# Patient Record
Sex: Female | Born: 1980 | Race: Black or African American | Hispanic: No | Marital: Married | Smoking: Never smoker
Health system: Southern US, Community
[De-identification: ages and names within clinical notes are randomized; demographics above are authoritative.]

## PROBLEM LIST (undated history)

## (undated) DIAGNOSIS — D62 Acute posthemorrhagic anemia: Secondary | ICD-10-CM

## (undated) DIAGNOSIS — Z8619 Personal history of other infectious and parasitic diseases: Secondary | ICD-10-CM

## (undated) DIAGNOSIS — D509 Iron deficiency anemia, unspecified: Secondary | ICD-10-CM

## (undated) DIAGNOSIS — O99019 Anemia complicating pregnancy, unspecified trimester: Secondary | ICD-10-CM

## (undated) HISTORY — PX: DILATION AND CURETTAGE OF UTERUS: SHX78

## (undated) HISTORY — PX: OTHER SURGICAL HISTORY: SHX169

---

## 1999-05-15 ENCOUNTER — Emergency Department (HOSPITAL_COMMUNITY): Admission: EM | Admit: 1999-05-15 | Discharge: 1999-05-15 | Payer: Self-pay | Admitting: Emergency Medicine

## 1999-09-13 ENCOUNTER — Inpatient Hospital Stay (HOSPITAL_COMMUNITY): Admission: AD | Admit: 1999-09-13 | Discharge: 1999-09-13 | Payer: Self-pay | Admitting: *Deleted

## 1999-09-20 ENCOUNTER — Inpatient Hospital Stay (HOSPITAL_COMMUNITY): Admission: AD | Admit: 1999-09-20 | Discharge: 1999-09-20 | Payer: Self-pay | Admitting: *Deleted

## 1999-11-02 ENCOUNTER — Ambulatory Visit (HOSPITAL_COMMUNITY): Admission: RE | Admit: 1999-11-02 | Discharge: 1999-11-02 | Payer: Self-pay | Admitting: *Deleted

## 1999-12-13 ENCOUNTER — Ambulatory Visit (HOSPITAL_COMMUNITY): Admission: RE | Admit: 1999-12-13 | Discharge: 1999-12-13 | Payer: Self-pay | Admitting: *Deleted

## 2000-04-12 ENCOUNTER — Encounter (HOSPITAL_COMMUNITY): Admission: RE | Admit: 2000-04-12 | Discharge: 2000-04-20 | Payer: Self-pay | Admitting: Obstetrics

## 2000-04-19 ENCOUNTER — Inpatient Hospital Stay (HOSPITAL_COMMUNITY): Admission: RE | Admit: 2000-04-19 | Discharge: 2000-04-21 | Payer: Self-pay | Admitting: Obstetrics

## 2000-07-17 ENCOUNTER — Emergency Department (HOSPITAL_COMMUNITY): Admission: EM | Admit: 2000-07-17 | Discharge: 2000-07-18 | Payer: Self-pay | Admitting: *Deleted

## 2000-07-18 ENCOUNTER — Encounter: Payer: Self-pay | Admitting: Emergency Medicine

## 2000-07-31 ENCOUNTER — Emergency Department (HOSPITAL_COMMUNITY): Admission: EM | Admit: 2000-07-31 | Discharge: 2000-07-31 | Payer: Self-pay | Admitting: Emergency Medicine

## 2000-08-02 ENCOUNTER — Encounter: Payer: Self-pay | Admitting: Emergency Medicine

## 2000-08-02 ENCOUNTER — Emergency Department (HOSPITAL_COMMUNITY): Admission: EM | Admit: 2000-08-02 | Discharge: 2000-08-02 | Payer: Self-pay | Admitting: Emergency Medicine

## 2000-08-04 ENCOUNTER — Emergency Department (HOSPITAL_COMMUNITY): Admission: EM | Admit: 2000-08-04 | Discharge: 2000-08-04 | Payer: Self-pay | Admitting: Emergency Medicine

## 2000-08-07 ENCOUNTER — Emergency Department (HOSPITAL_COMMUNITY): Admission: EM | Admit: 2000-08-07 | Discharge: 2000-08-07 | Payer: Self-pay | Admitting: Emergency Medicine

## 2000-08-19 ENCOUNTER — Emergency Department (HOSPITAL_COMMUNITY): Admission: EM | Admit: 2000-08-19 | Discharge: 2000-08-19 | Payer: Self-pay | Admitting: Emergency Medicine

## 2000-08-22 ENCOUNTER — Emergency Department (HOSPITAL_COMMUNITY): Admission: EM | Admit: 2000-08-22 | Discharge: 2000-08-22 | Payer: Self-pay | Admitting: Emergency Medicine

## 2000-08-22 ENCOUNTER — Encounter: Payer: Self-pay | Admitting: Emergency Medicine

## 2000-08-26 ENCOUNTER — Encounter: Payer: Self-pay | Admitting: Emergency Medicine

## 2000-08-26 ENCOUNTER — Emergency Department (HOSPITAL_COMMUNITY): Admission: EM | Admit: 2000-08-26 | Discharge: 2000-08-26 | Payer: Self-pay | Admitting: Emergency Medicine

## 2000-09-03 ENCOUNTER — Encounter: Admission: RE | Admit: 2000-09-03 | Discharge: 2000-09-03 | Payer: Self-pay | Admitting: Internal Medicine

## 2000-09-06 ENCOUNTER — Encounter: Payer: Self-pay | Admitting: Emergency Medicine

## 2000-09-06 ENCOUNTER — Emergency Department (HOSPITAL_COMMUNITY): Admission: EM | Admit: 2000-09-06 | Discharge: 2000-09-06 | Payer: Self-pay | Admitting: Emergency Medicine

## 2000-09-08 ENCOUNTER — Emergency Department (HOSPITAL_COMMUNITY): Admission: EM | Admit: 2000-09-08 | Discharge: 2000-09-08 | Payer: Self-pay | Admitting: Emergency Medicine

## 2000-09-12 ENCOUNTER — Encounter: Admission: RE | Admit: 2000-09-12 | Discharge: 2000-09-12 | Payer: Self-pay | Admitting: Internal Medicine

## 2000-09-19 ENCOUNTER — Emergency Department (HOSPITAL_COMMUNITY): Admission: EM | Admit: 2000-09-19 | Discharge: 2000-09-19 | Payer: Self-pay | Admitting: Emergency Medicine

## 2000-09-19 ENCOUNTER — Encounter: Payer: Self-pay | Admitting: Emergency Medicine

## 2000-09-20 ENCOUNTER — Encounter: Admission: RE | Admit: 2000-09-20 | Discharge: 2000-09-20 | Payer: Self-pay | Admitting: Internal Medicine

## 2000-09-27 ENCOUNTER — Encounter: Admission: RE | Admit: 2000-09-27 | Discharge: 2000-09-27 | Payer: Self-pay | Admitting: Internal Medicine

## 2001-07-23 ENCOUNTER — Emergency Department (HOSPITAL_COMMUNITY): Admission: EM | Admit: 2001-07-23 | Discharge: 2001-07-23 | Payer: Self-pay | Admitting: Emergency Medicine

## 2003-01-05 ENCOUNTER — Emergency Department (HOSPITAL_COMMUNITY): Admission: EM | Admit: 2003-01-05 | Discharge: 2003-01-06 | Payer: Self-pay | Admitting: Emergency Medicine

## 2003-07-08 ENCOUNTER — Other Ambulatory Visit: Admission: RE | Admit: 2003-07-08 | Discharge: 2003-07-08 | Payer: Self-pay | Admitting: Gynecology

## 2003-10-21 ENCOUNTER — Other Ambulatory Visit: Admission: RE | Admit: 2003-10-21 | Discharge: 2003-10-21 | Payer: Self-pay | Admitting: Gynecology

## 2005-12-04 ENCOUNTER — Other Ambulatory Visit: Admission: RE | Admit: 2005-12-04 | Discharge: 2005-12-04 | Payer: Self-pay | Admitting: Gynecology

## 2010-02-09 ENCOUNTER — Other Ambulatory Visit
Admission: RE | Admit: 2010-02-09 | Discharge: 2010-02-09 | Payer: Self-pay | Source: Home / Self Care | Admitting: Gynecology

## 2010-02-09 ENCOUNTER — Ambulatory Visit: Payer: Self-pay | Admitting: Women's Health

## 2010-03-06 NOTE — L&D Delivery Note (Signed)
Delivery Note At 6:39 AM a viable and healthy female was delivered via Vaginal, Spontaneous Delivery (Presentation: Right Occiput Anterior).  APGAR: 9, 9; weight 7 lb 13.8 oz (3565 g).   Placenta status: Intact, Spontaneous.  Cord: 3 vessels with the following complications: None.  Cord pH: none  Anesthesia: Epidural  Episiotomy: None Lacerations: None Suture Repair: none Est. Blood Loss (mL): 250  Mom to postpartum.  Baby to nursery-stable.  Jessica Gallegos A 12/03/2010, 7:04 AM

## 2010-04-15 ENCOUNTER — Ambulatory Visit: Payer: Self-pay | Admitting: Women's Health

## 2010-04-15 DIAGNOSIS — N912 Amenorrhea, unspecified: Secondary | ICD-10-CM

## 2010-05-03 ENCOUNTER — Emergency Department (HOSPITAL_COMMUNITY)
Admission: EM | Admit: 2010-05-03 | Discharge: 2010-05-03 | Disposition: A | Discharge status: Home / Self Care | Payer: Self-pay | Attending: Emergency Medicine | Admitting: Emergency Medicine

## 2010-05-03 DIAGNOSIS — R51 Headache: Secondary | ICD-10-CM | POA: Insufficient documentation

## 2010-05-03 DIAGNOSIS — O99891 Other specified diseases and conditions complicating pregnancy: Secondary | ICD-10-CM | POA: Insufficient documentation

## 2010-05-16 ENCOUNTER — Inpatient Hospital Stay (HOSPITAL_COMMUNITY)
Admission: AD | Admit: 2010-05-16 | Discharge: 2010-05-16 | Disposition: A | Discharge status: Home / Self Care | Payer: Self-pay | Source: Ambulatory Visit | Attending: Family Medicine | Admitting: Family Medicine

## 2010-05-16 DIAGNOSIS — O99891 Other specified diseases and conditions complicating pregnancy: Secondary | ICD-10-CM | POA: Insufficient documentation

## 2010-05-16 DIAGNOSIS — R109 Unspecified abdominal pain: Secondary | ICD-10-CM | POA: Insufficient documentation

## 2010-05-16 LAB — URINALYSIS, ROUTINE W REFLEX MICROSCOPIC
Ketones, ur: 15 mg/dL — AB
Nitrite: NEGATIVE
Protein, ur: NEGATIVE mg/dL
Urobilinogen, UA: 0.2 mg/dL (ref 0.0–1.0)

## 2010-05-17 LAB — URINE CULTURE

## 2010-06-08 LAB — HIV ANTIBODY (ROUTINE TESTING W REFLEX): HIV: NONREACTIVE

## 2010-06-08 LAB — RUBELLA ANTIBODY, IGM: Rubella: IMMUNE

## 2010-06-08 LAB — ABO/RH: RH Type: POSITIVE

## 2010-10-01 ENCOUNTER — Inpatient Hospital Stay (HOSPITAL_COMMUNITY): Payer: 59

## 2010-10-01 ENCOUNTER — Ambulatory Visit (HOSPITAL_COMMUNITY)
Admission: AD | Admit: 2010-10-01 | Discharge: 2010-10-01 | Disposition: A | Discharge status: Home / Self Care | Payer: 59 | Source: Ambulatory Visit | Admitting: Obstetrics

## 2010-10-01 ENCOUNTER — Encounter (HOSPITAL_COMMUNITY): Payer: Self-pay | Admitting: Obstetrics and Gynecology

## 2010-10-01 ENCOUNTER — Inpatient Hospital Stay (HOSPITAL_COMMUNITY)
Admission: AD | Admit: 2010-10-01 | Discharge: 2010-10-01 | Disposition: A | Discharge status: Home / Self Care | Payer: 59 | Source: Ambulatory Visit | Attending: Obstetrics and Gynecology | Admitting: Obstetrics and Gynecology

## 2010-10-01 DIAGNOSIS — O479 False labor, unspecified: Secondary | ICD-10-CM | POA: Insufficient documentation

## 2010-10-01 LAB — URINALYSIS, ROUTINE W REFLEX MICROSCOPIC
Glucose, UA: NEGATIVE mg/dL
Leukocytes, UA: NEGATIVE
Protein, ur: NEGATIVE mg/dL
Specific Gravity, Urine: <1.005 — ABNORMAL LOW (ref 1.005–1.030)
pH: 5.5 (ref 5.0–8.0)

## 2010-10-01 MED ORDER — BETAMETHASONE SOD PHOS & ACET 6 (3-3) MG/ML IJ SUSP
12.0000 mg | Freq: Once | INTRAMUSCULAR | Status: AC
  Administered 2010-10-01: 12 mg via INTRAMUSCULAR
  Filled 2010-10-01: qty 2

## 2010-10-01 MED ORDER — NIFEDIPINE 10 MG PO CAPS
10.0000 mg | ORAL_CAPSULE | Freq: Once | ORAL | Status: AC
  Administered 2010-10-01: 10 mg via ORAL

## 2010-10-01 MED ORDER — NIFEDIPINE 10 MG PO CAPS
ORAL_CAPSULE | ORAL | Status: AC
  Filled 2010-10-01: qty 1

## 2010-10-01 MED ORDER — NIFEDIPINE 10 MG PO CAPS
ORAL_CAPSULE | ORAL | Status: AC
  Administered 2010-10-01: 10 mg via ORAL
  Filled 2010-10-01: qty 1

## 2010-10-01 NOTE — Progress Notes (Signed)
"  UC's since 1900 about every 10-15 mins.  They feel about the same now, but they are just still going on.  I called [facility] and they told me to come here."

## 2010-10-01 NOTE — ED Provider Notes (Addendum)
History     Chief Complaint  Patient presents with  . Contractions   HPI 30 yo [id number] MBF now @ 29 1/[redacted] wk gestation presents w/ complaint of ctx since 7 pm. (-)vaginal bleeding. Pt had intercourse @ 5 pm. Last office visit 7/24: 3lb 4 oz(94%) nl AFI. Cervix was closed  OB History    Grav Para Term Preterm Abortions TAB SAB Ect Mult Living   5 1 1  3 3    1       No past medical history on file.  No past surgical history on file.  No family history on file.  History  Substance Use Topics  . Smoking status: Not on file  . Smokeless tobacco: Not on file  . Alcohol Use: Not on file    Allergies: No Known Allergies  No prescriptions prior to admission    ROS (-)urinary sx, back pain or vaginal d/c Physical Exam   Blood pressure 96/67, pulse 101, temperature 98.6 F (37 C), temperature source Oral, resp. rate 18, height 5\' 8"  (1.727 m), weight 57.97 kg (127 lb 12.8 oz).  Physical Exam  Constitutional: She is oriented to person, place, and time. She appears well-developed and well-nourished.  Eyes: EOM are normal.  Neck: Neck supple.  Cardiovascular: Regular rhythm.   Respiratory: Breath sounds normal.  Genitourinary:       Gravid soft  Cervix 1 cm/thick/-3  Musculoskeletal: She exhibits no edema.  Neurological: She is alert and oriented to person, place, and time.  Skin: Skin is warm and dry.    MAU Course  Procedures. Tracing reviewed. Ctx q 2-5 mins baseline 145-150 (+) accel 160-170  MDM  Assessment and Plan  PTL IUP @ 29 1/7  P) FFN, betamethasone, procardia, cervical length(u/s), u/a, ucx. GBS cx.  Waqas Bruhl A 10/01/2010, 2:43 AM   S/p procardia. Cervical length 1.8 cm. Betamethasone x 1 given. F/u tracing post procardia. Ctx resolved.  P) will d/c home on procardia 10 mg po q 6 hrs. Repeat betamethasone 12 hrs due to time of first dose(3am). PTL prec. F/u office next week. OOW. Bedrest. FFN pending.       FFN (+) as expected due to  intercourse. Will repeat 1 wk

## 2010-10-02 LAB — URINE CULTURE
Colony Count: NO GROWTH
Special Requests: NORMAL

## 2010-10-03 LAB — CULTURE, BETA STREP (GROUP B ONLY)

## 2010-12-03 ENCOUNTER — Encounter (HOSPITAL_COMMUNITY): Payer: Self-pay

## 2010-12-03 ENCOUNTER — Encounter (HOSPITAL_COMMUNITY): Payer: Self-pay | Admitting: Anesthesiology

## 2010-12-03 ENCOUNTER — Inpatient Hospital Stay (HOSPITAL_COMMUNITY)
Admission: AD | Admit: 2010-12-03 | Discharge: 2010-12-04 | DRG: 775 | Disposition: A | Discharge status: Home / Self Care | Payer: 59 | Source: Ambulatory Visit | Attending: Obstetrics and Gynecology | Admitting: Obstetrics and Gynecology

## 2010-12-03 ENCOUNTER — Inpatient Hospital Stay (HOSPITAL_COMMUNITY): Payer: 59 | Admitting: Anesthesiology

## 2010-12-03 DIAGNOSIS — D509 Iron deficiency anemia, unspecified: Secondary | ICD-10-CM | POA: Diagnosis present

## 2010-12-03 DIAGNOSIS — O9903 Anemia complicating the puerperium: Principal | ICD-10-CM | POA: Diagnosis not present

## 2010-12-03 DIAGNOSIS — D62 Acute posthemorrhagic anemia: Secondary | ICD-10-CM | POA: Diagnosis not present

## 2010-12-03 DIAGNOSIS — IMO0001 Reserved for inherently not codable concepts without codable children: Secondary | ICD-10-CM

## 2010-12-03 HISTORY — DX: Anemia complicating pregnancy, unspecified trimester: O99.019

## 2010-12-03 HISTORY — DX: Iron deficiency anemia, unspecified: D50.9

## 2010-12-03 HISTORY — DX: Acute posthemorrhagic anemia: D62

## 2010-12-03 HISTORY — DX: Personal history of other infectious and parasitic diseases: Z86.19

## 2010-12-03 LAB — CBC
HCT: 30.1 % — ABNORMAL LOW (ref 36.0–46.0)
Hemoglobin: 10 g/dL — ABNORMAL LOW (ref 12.0–15.0)
MCH: 29.9 pg (ref 26.0–34.0)
MCHC: 33.2 g/dL (ref 30.0–36.0)
RDW: 13.8 % (ref 11.5–15.5)

## 2010-12-03 MED ORDER — ONDANSETRON HCL 4 MG/2ML IJ SOLN: 4.0000 mg | INTRAMUSCULAR | Status: DC | PRN

## 2010-12-03 MED ORDER — IBUPROFEN 600 MG PO TABS
600.0000 mg | ORAL_TABLET | Freq: Four times a day (QID) | ORAL | Status: DC | PRN
  Filled 2010-12-03: qty 1

## 2010-12-03 MED ORDER — ONDANSETRON HCL 4 MG PO TABS: 4.0000 mg | ORAL_TABLET | ORAL | Status: DC | PRN

## 2010-12-03 MED ORDER — ONDANSETRON HCL 4 MG/2ML IJ SOLN: 4.0000 mg | Freq: Four times a day (QID) | INTRAMUSCULAR | Status: DC | PRN

## 2010-12-03 MED ORDER — IBUPROFEN 600 MG PO TABS
600.0000 mg | ORAL_TABLET | Freq: Four times a day (QID) | ORAL | Status: DC
  Administered 2010-12-03 – 2010-12-04 (×5): 600 mg via ORAL
  Filled 2010-12-03 (×4): qty 1

## 2010-12-03 MED ORDER — DIBUCAINE 1 % RE OINT: 1.0000 "application " | TOPICAL_OINTMENT | RECTAL | Status: DC | PRN

## 2010-12-03 MED ORDER — PHENYLEPHRINE 40 MCG/ML (10ML) SYRINGE FOR IV PUSH (FOR BLOOD PRESSURE SUPPORT)
80.0000 ug | PREFILLED_SYRINGE | INTRAVENOUS | Status: DC | PRN
  Filled 2010-12-03: qty 5

## 2010-12-03 MED ORDER — SENNOSIDES-DOCUSATE SODIUM 8.6-50 MG PO TABS
2.0000 | ORAL_TABLET | Freq: Every day | ORAL | Status: DC
  Administered 2010-12-03: 2 via ORAL

## 2010-12-03 MED ORDER — DIPHENHYDRAMINE HCL 25 MG PO CAPS: 25.0000 mg | ORAL_CAPSULE | Freq: Four times a day (QID) | ORAL | Status: DC | PRN

## 2010-12-03 MED ORDER — OXYCODONE-ACETAMINOPHEN 5-325 MG PO TABS: 1.0000 | ORAL_TABLET | ORAL | Status: DC | PRN

## 2010-12-03 MED ORDER — FENTANYL 2.5 MCG/ML BUPIVACAINE 1/10 % EPIDURAL INFUSION (WH - ANES)
14.0000 mL/h | INTRAMUSCULAR | Status: DC
  Administered 2010-12-03: 14 mL/h via EPIDURAL
  Filled 2010-12-03: qty 60

## 2010-12-03 MED ORDER — OXYCODONE-ACETAMINOPHEN 5-325 MG PO TABS: 2.0000 | ORAL_TABLET | ORAL | Status: DC | PRN

## 2010-12-03 MED ORDER — FERROUS SULFATE 325 (65 FE) MG PO TABS
325.0000 mg | ORAL_TABLET | Freq: Two times a day (BID) | ORAL | Status: DC
  Administered 2010-12-03 – 2010-12-04 (×2): 325 mg via ORAL
  Filled 2010-12-03 (×2): qty 1

## 2010-12-03 MED ORDER — CITRIC ACID-SODIUM CITRATE 334-500 MG/5ML PO SOLN: 30.0000 mL | ORAL | Status: DC | PRN

## 2010-12-03 MED ORDER — TETANUS-DIPHTH-ACELL PERTUSSIS 5-2.5-18.5 LF-MCG/0.5 IM SUSP
0.5000 mL | Freq: Once | INTRAMUSCULAR | Status: AC
  Administered 2010-12-04: 0.5 mL via INTRAMUSCULAR
  Filled 2010-12-03: qty 0.5

## 2010-12-03 MED ORDER — OXYTOCIN 10 UNIT/ML IJ SOLN
INTRAMUSCULAR | Status: AC
  Filled 2010-12-03: qty 2

## 2010-12-03 MED ORDER — LANOLIN HYDROUS EX OINT: TOPICAL_OINTMENT | CUTANEOUS | Status: DC | PRN

## 2010-12-03 MED ORDER — LIDOCAINE HCL (PF) 1 % IJ SOLN
30.0000 mL | INTRAMUSCULAR | Status: DC | PRN
  Filled 2010-12-03 (×2): qty 30

## 2010-12-03 MED ORDER — MEDROXYPROGESTERONE ACETATE 150 MG/ML IM SUSP
150.0000 mg | INTRAMUSCULAR | Status: AC | PRN
  Administered 2010-12-04: 150 mg via INTRAMUSCULAR
  Filled 2010-12-03: qty 1

## 2010-12-03 MED ORDER — LACTATED RINGERS IV SOLN: 500.0000 mL | Freq: Once | INTRAVENOUS | Status: DC

## 2010-12-03 MED ORDER — PHENYLEPHRINE 40 MCG/ML (10ML) SYRINGE FOR IV PUSH (FOR BLOOD PRESSURE SUPPORT)
80.0000 ug | PREFILLED_SYRINGE | INTRAVENOUS | Status: DC | PRN
  Filled 2010-12-03 (×2): qty 5

## 2010-12-03 MED ORDER — DIPHENHYDRAMINE HCL 50 MG/ML IJ SOLN: 12.5000 mg | INTRAMUSCULAR | Status: DC | PRN

## 2010-12-03 MED ORDER — FLEET ENEMA 7-19 GM/118ML RE ENEM: 1.0000 | ENEMA | RECTAL | Status: DC | PRN

## 2010-12-03 MED ORDER — LACTATED RINGERS IV SOLN: 500.0000 mL | INTRAVENOUS | Status: DC | PRN

## 2010-12-03 MED ORDER — ACETAMINOPHEN 325 MG PO TABS: 650.0000 mg | ORAL_TABLET | ORAL | Status: DC | PRN

## 2010-12-03 MED ORDER — OXYTOCIN 20 UNITS IN LACTATED RINGERS INFUSION - SIMPLE
125.0000 mL/h | Freq: Once | INTRAVENOUS | Status: AC
  Administered 2010-12-03: 125 mL/h via INTRAVENOUS

## 2010-12-03 MED ORDER — EPHEDRINE 5 MG/ML INJ
10.0000 mg | INTRAVENOUS | Status: DC | PRN
  Filled 2010-12-03 (×2): qty 4

## 2010-12-03 MED ORDER — ZOLPIDEM TARTRATE 5 MG PO TABS: 5.0000 mg | ORAL_TABLET | Freq: Every evening | ORAL | Status: DC | PRN

## 2010-12-03 MED ORDER — EPHEDRINE 5 MG/ML INJ
10.0000 mg | INTRAVENOUS | Status: DC | PRN
  Filled 2010-12-03: qty 4

## 2010-12-03 MED ORDER — SIMETHICONE 80 MG PO CHEW: 80.0000 mg | CHEWABLE_TABLET | ORAL | Status: DC | PRN

## 2010-12-03 MED ORDER — LIDOCAINE HCL 1.5 % IJ SOLN
INTRAMUSCULAR | Status: DC | PRN
  Administered 2010-12-03 (×2): 5 mL via EPIDURAL
  Administered 2010-12-03: 2 mL via EPIDURAL

## 2010-12-03 MED ORDER — WITCH HAZEL-GLYCERIN EX PADS: 1.0000 "application " | MEDICATED_PAD | CUTANEOUS | Status: DC | PRN

## 2010-12-03 MED ORDER — BENZOCAINE-MENTHOL 20-0.5 % EX AERO: 1.0000 "application " | INHALATION_SPRAY | CUTANEOUS | Status: DC | PRN

## 2010-12-03 MED ORDER — OXYTOCIN BOLUS FROM INFUSION
500.0000 mL | Freq: Once | INTRAVENOUS | Status: DC
  Filled 2010-12-03: qty 1000
  Filled 2010-12-03: qty 500

## 2010-12-03 MED ORDER — BUTORPHANOL TARTRATE 2 MG/ML IJ SOLN: 1.0000 mg | INTRAMUSCULAR | Status: DC | PRN

## 2010-12-03 MED ORDER — PRENATAL PLUS 27-1 MG PO TABS
1.0000 | ORAL_TABLET | Freq: Every day | ORAL | Status: DC
  Administered 2010-12-04: 1 via ORAL
  Filled 2010-12-03: qty 1

## 2010-12-03 MED ORDER — LACTATED RINGERS IV SOLN
INTRAVENOUS | Status: DC
  Administered 2010-12-03 (×2): via INTRAVENOUS

## 2010-12-03 NOTE — Anesthesia Preprocedure Evaluation (Signed)
Anesthesia Evaluation  Name, MR# and DOB Patient awake  General Assessment Comment  Reviewed: Allergy & Precautions, H&P , NPO status , Patient's Chart, lab work & pertinent test results, reviewed documented beta blocker date and time   History of Anesthesia Complications Negative for: history of anesthetic complications  Airway Mallampati: III TM Distance: >3 FB Neck ROM: full    Dental  (+) Teeth Intact   Pulmonary  clear to auscultation  breath sounds clear to auscultation none    Cardiovascular regular Normal    Neuro/Psych   Headaches (food related migraines - last one many months ago)   Negative Psych ROS  GI/Hepatic/Renal negative GI ROS  negative Liver ROS  negative Renal ROS        Endo/Other  Negative Endocrine ROS (+)      Abdominal   Musculoskeletal   Hematology negative hematology ROS (+)   Peds  Reproductive/Obstetrics (+) Pregnancy    Anesthesia Other Findings             Anesthesia Physical Anesthesia Plan  ASA: II  Anesthesia Plan: Epidural   Post-op Pain Management:    Induction:   Airway Management Planned:   Additional Equipment:   Intra-op Plan:   Post-operative Plan:   Informed Consent: I have reviewed the patients History and Physical, chart, labs and discussed the procedure including the risks, benefits and alternatives for the proposed anesthesia with the patient or authorized representative who has indicated his/her understanding and acceptance.     Plan Discussed with:   Anesthesia Plan Comments:         Anesthesia Quick Evaluation

## 2010-12-03 NOTE — H&P (Signed)
Jessica Gallegos is a 30 y.o. female presenting for active labor   History OB History    Grav Para Term Preterm Abortions TAB SAB Ect Mult Living   4 1 1  2 2    1      Past Medical History  Diagnosis Date  . Migraine   . History of chicken pox    Past Surgical History  Procedure Date  . Dilation and currettage x2   . Dilation and curettage of uterus    Family History: family history is not on file. Social History:  reports that she has never smoked. She does not have any smokeless tobacco history on file. She reports that she does not drink alcohol or use illicit drugs.  Review of Systems  Genitourinary: Negative.   All other systems reviewed and are negative.  Tracing: baseline 158 (+) accel. Ctx q 2-3 mins  Dilation: 10 Effacement (%): 100 Station: -1 Exam by:: GPayne, RN Blood pressure 122/107, pulse 90, temperature 98.3 F (36.8 C), temperature source Oral, resp. rate 18, height 5\' 8"  (1.727 m), weight 138 lb 12.8 oz (62.959 kg). Maternal Exam:  Uterine Assessment: Contraction strength is moderate.  Contraction frequency is regular.   Abdomen: Patient reports no abdominal tenderness. Fetal presentation: vertex  Introitus: Normal vulva.   Physical Exam  Constitutional: She is oriented to person, place, and time. She appears well-developed and well-nourished.  HENT:  Head: Normocephalic.  Eyes: EOM are normal.  Neck: Neck supple.  Cardiovascular: Regular rhythm.   Respiratory: Breath sounds normal.  Neurological: She is alert and oriented to person, place, and time.  Skin: Skin is warm and dry.    Prenatal labs: ABO, Rh: B/Positive/-- (04/04 0000) Antibody:  neg Rubella: Immune (04/04 0000) RPR: Nonreactive (04/04 0000)  HBsAg: Negative (04/04 0000)  HIV: Non-reactive (04/04 0000)  GBS:   neg  Assessment/Plan: Active labor IUP @ 38 wk Desires Sterilization P) Admit routine lab, Epidural, amniotomy  Jessica Gallegos A 12/03/2010, 5:19 AM

## 2010-12-03 NOTE — Progress Notes (Signed)
G2P1 at 38wks. Ctxs since 2400 with bloody show. Cervix 4cm last ck

## 2010-12-03 NOTE — Progress Notes (Signed)
Patient is here with c/o q6-31mins with bloody show. She states that she was 4cm today. Denies lof

## 2010-12-03 NOTE — Progress Notes (Signed)
BEVERLEE WILMARTH is a 30 y.o. [id number] at [redacted]w[redacted]d by LMP admitted for active labor  Subjective: Chief Complaint  Patient presents with  . Contractions  Epidural  Objective: BP 122/107  Pulse 90  Temp(Src) 98.3 F (36.8 C) (Oral)  Resp 18  Ht 5\' 8"  (1.727 m)  Wt 138 lb 12.8 oz (62.959 kg)  BMI 21.10 kg/m2      FHT:  FHR: 158 bpm, variability: moderate,  accelerations:  Present,  decelerations:  Absent UC:   regular, every 2-3 minutes SVE: 9/100/-1 asynclytic ROA AROM clear fluid ISE placed  Labs: Lab Results  Component Value Date   WBC 7.7 12/03/2010   HGB 10.0* 12/03/2010   HCT 30.1* 12/03/2010   MCV 90.1 12/03/2010   PLT 209 12/03/2010    Assessment / Plan: Spontaneous labor, progressing normally   Anticipated MOD:  NSVD P) exaggerated right sims. Pitocin prn  Veasna Santibanez A 12/03/2010, 5:30 AM

## 2010-12-03 NOTE — Anesthesia Procedure Notes (Signed)
Epidural Patient location during procedure: OB Start time: 12/03/2010 3:59 AM Reason for block: procedure for pain  Staffing Performed by: anesthesiologist   Preanesthetic Checklist Completed: patient identified, site marked, surgical consent, pre-op evaluation, timeout performed, IV checked, risks and benefits discussed and monitors and equipment checked  Epidural Patient position: sitting Prep: site prepped and draped and DuraPrep Patient monitoring: continuous pulse ox and blood pressure Approach: midline Injection technique: LOR air  Needle:  Needle type: Tuohy  Needle gauge: 17 G Needle length: 9 cm Needle insertion depth: 4 cm Catheter type: closed end flexible Catheter size: 19 Gauge Catheter at skin depth: 9 cm Test dose: negative  Assessment Events: blood not aspirated, injection not painful, no injection resistance, negative IV test and no paresthesia

## 2010-12-03 NOTE — Progress Notes (Signed)
Dr cousins called back and was notified of tracing, ctx pattern and sve result. Order to admit and patient may have epidural if she wants it.

## 2010-12-04 ENCOUNTER — Encounter (HOSPITAL_COMMUNITY): Payer: Self-pay

## 2010-12-04 DIAGNOSIS — D62 Acute posthemorrhagic anemia: Secondary | ICD-10-CM

## 2010-12-04 DIAGNOSIS — D509 Iron deficiency anemia, unspecified: Secondary | ICD-10-CM

## 2010-12-04 DIAGNOSIS — O99019 Anemia complicating pregnancy, unspecified trimester: Secondary | ICD-10-CM | POA: Diagnosis present

## 2010-12-04 HISTORY — DX: Iron deficiency anemia, unspecified: D50.9

## 2010-12-04 HISTORY — DX: Acute posthemorrhagic anemia: D62

## 2010-12-04 LAB — CBC
HCT: 26.6 % — ABNORMAL LOW (ref 36.0–46.0)
MCHC: 32.3 g/dL (ref 30.0–36.0)
MCV: 90.8 fL (ref 78.0–100.0)
RDW: 14.1 % (ref 11.5–15.5)

## 2010-12-04 MED ORDER — EPINEPHRINE TOPICAL FOR CIRCUMCISION 0.1 MG/ML: 1.0000 [drp] | TOPICAL | Status: DC | PRN

## 2010-12-04 MED ORDER — POLYSACCHARIDE IRON 150 MG PO CAPS: 150.0000 mg | ORAL_CAPSULE | Freq: Every day | ORAL | Status: DC

## 2010-12-04 MED ORDER — MEDROXYPROGESTERONE ACETATE 150 MG/ML IM SUSP: 150.0000 mg | Freq: Once | INTRAMUSCULAR | Status: DC

## 2010-12-04 MED ORDER — PRENATAL PLUS 27-1 MG PO TABS: 1.0000 | ORAL_TABLET | Freq: Every day | ORAL | Status: DC

## 2010-12-04 MED ORDER — ACETAMINOPHEN FOR CIRCUMCISION 160 MG/5 ML: 40.0000 mg | Freq: Once | ORAL | Status: DC

## 2010-12-04 MED ORDER — CALCIUM CARBONATE-VITAMIN D 500-200 MG-UNIT PO TABS: 2.0000 | ORAL_TABLET | Freq: Every day | ORAL | Status: DC

## 2010-12-04 MED ORDER — ACETAMINOPHEN FOR CIRCUMCISION 160 MG/5 ML: 40.0000 mg | Freq: Once | ORAL | Status: DC | PRN

## 2010-12-04 MED ORDER — SUCROSE 24 % ORAL SOLUTION: 1.0000 mL | OROMUCOSAL | Status: DC

## 2010-12-04 MED ORDER — LIDOCAINE 1%/NA BICARB 0.1 MEQ INJECTION: 0.8000 mL | INJECTION | Freq: Once | INTRAVENOUS | Status: DC

## 2010-12-04 MED ORDER — DOCUSATE SODIUM 100 MG PO CAPS: 100.0000 mg | ORAL_CAPSULE | Freq: Every day | ORAL | Status: DC

## 2010-12-04 MED ORDER — CALCIUM CARBONATE-VITAMIN D 500-200 MG-UNIT PO TABS
2.0000 | ORAL_TABLET | Freq: Every day | ORAL | Status: DC
  Filled 2010-12-04: qty 2

## 2010-12-04 MED ORDER — POLYSACCHARIDE IRON 150 MG PO CAPS
150.0000 mg | ORAL_CAPSULE | Freq: Every day | ORAL | Status: DC
  Filled 2010-12-04: qty 1

## 2010-12-04 MED ORDER — IBUPROFEN 800 MG PO TABS: 800.0000 mg | ORAL_TABLET | Freq: Three times a day (TID) | ORAL | Status: AC | PRN

## 2010-12-04 MED ORDER — DSS 100 MG PO CAPS: 100.0000 mg | ORAL_CAPSULE | Freq: Every day | ORAL | Status: AC

## 2010-12-04 MED ORDER — INFLUENZA VIRUS VACC SPLIT PF IM SUSP
0.5000 mL | Freq: Once | INTRAMUSCULAR | Status: AC
  Administered 2010-12-04: 0.5 mL via INTRAMUSCULAR
  Filled 2010-12-04: qty 0.5

## 2010-12-04 NOTE — Discharge Summary (Signed)
Obstetric Discharge Summary Reason for Admission: onset of labor Prenatal Procedures: ultrasound and betamethasone , Procardia Intrapartum Procedures: spontaneous vaginal delivery Postpartum Procedures: TdaP, flu shot, Depo Provera Complications-Operative and Postpartum: none Hemoglobin  Date Value Range Status  12/04/2010 8.6* 12.0-15.0 (g/dL) Final     HCT  Date Value Range Status  12/04/2010 26.6* 36.0-46.0 (%) Final    Discharge Diagnoses: Term Pregnancy-delivered and ABL anemia over cIDA anemia  Discharge Information: Date: 12/04/2010 Activity: pelvic rest Diet: routine and increased dietary iron Medications: PNV, Ibuprofen, Colace, Iron and Vit D and Calcium Condition: stable Instructions: refer to practice specific booklet Discharge to: home Follow-up Information    Follow up with COUSINS,SHERONETTE A, MD in 6 weeks.   Contact information:   [address] [place] [address] [phone]          Newborn Data: Live born female  Birth Weight: 7 lb 13.8 oz (3566 g) APGAR: 9, 9  Home with mother.  Jessica Gallegos 12/04/2010, 9:32 AM

## 2010-12-04 NOTE — Progress Notes (Signed)
  Post Partum Day #1            Subjective:   Pt reports feeling well, desires early discharge/ Tolerating po/ Voiding without problems/ No n/v/ Bleeding is moderate/ Pain controlled withprescription NSAID's including ibuprofen (Motrin)  Newborn info:  Information for the patient's newborn:  Lexiana, Spindel [[id number]]  female   / circumcision pending / feeding bottle Requesting Depo Provera inj prior to discharge.          Objective:     VS: Blood pressure 99/64, pulse 68, temperature 98.3 F (36.8 C), temperature source Oral, resp. rate 18, height 5\' 8"  (1.727 m), weight 62.959 kg (138 lb 12.8 oz), SpO2 99.00%, unknown if currently breastfeeding.  No intake or output data in the 24 hours ending 12/04/10 0927       Basename 12/04/10 0530 12/03/10 0309  WBC 8.6 7.7  HGB 8.6* 10.0*  HCT 26.6* 30.1*  PLT 166 209     Blood type: B/Positive/-- (04/04 0000)  Rubella: Immune (04/04 0000)     Physical Exam:   A & O x 3 NAD   Lungs: CTAB  Heart: RR  Abdomen: soft, non-tender, FF @ U  Perineum: intact, edema none  Lochia: small  Extremities: neg Homans', edema none    Assessment/Plan:  PPD # 1 / 30 y.o., [id number] S/P:spontaneous vaginal Principal Problem:  *Postpartum care following vaginal delivery (9/29) Active Problems:  Maternal iron deficiency anemia complicating pregnancy  Acute blood loss anemia start oral Fe and colace    normal postpartum exam  Doing well  Continue routine post partum orders  D/C home  Contraception - Depo provera, start Vit D and Ca supplementation, advised consider Implanon or IUD for long term birth control      LOS: 1 day   PAUL,DANIELA, CNM, MSN 12/04/2010, 9:27 AM

## 2010-12-07 NOTE — Anesthesia Postprocedure Evaluation (Signed)
   Complications: No apparent anesthesia complications 

## 2010-12-09 ENCOUNTER — Encounter (HOSPITAL_COMMUNITY): Payer: 59

## 2011-06-08 ENCOUNTER — Other Ambulatory Visit: Payer: Self-pay | Admitting: Obstetrics and Gynecology

## 2011-06-08 DIAGNOSIS — R1032 Left lower quadrant pain: Secondary | ICD-10-CM

## 2011-06-12 ENCOUNTER — Ambulatory Visit
Admission: RE | Admit: 2011-06-12 | Discharge: 2011-06-12 | Disposition: A | Discharge status: Home / Self Care | Payer: 59 | Source: Ambulatory Visit | Attending: Obstetrics and Gynecology | Admitting: Obstetrics and Gynecology

## 2011-06-12 DIAGNOSIS — R1032 Left lower quadrant pain: Secondary | ICD-10-CM

## 2011-06-12 MED ORDER — IOHEXOL 300 MG/ML  SOLN
100.0000 mL | Freq: Once | INTRAMUSCULAR | Status: AC | PRN
  Administered 2011-06-12: 100 mL via INTRAVENOUS

## 2011-07-03 ENCOUNTER — Encounter (HOSPITAL_COMMUNITY): Payer: Self-pay | Admitting: Pharmacist

## 2011-07-06 ENCOUNTER — Other Ambulatory Visit: Payer: Self-pay | Admitting: Obstetrics and Gynecology

## 2011-07-10 ENCOUNTER — Encounter (HOSPITAL_COMMUNITY): Payer: Self-pay

## 2011-07-10 ENCOUNTER — Encounter (HOSPITAL_COMMUNITY)
Admission: RE | Admit: 2011-07-10 | Discharge: 2011-07-10 | Disposition: A | Discharge status: Home / Self Care | Payer: 59 | Source: Ambulatory Visit | Attending: Obstetrics and Gynecology | Admitting: Obstetrics and Gynecology

## 2011-07-10 LAB — CBC
HCT: 36.7 % (ref 36.0–46.0)
MCHC: 33 g/dL (ref 30.0–36.0)
MCV: 92.2 fL (ref 78.0–100.0)
RDW: 13 % (ref 11.5–15.5)

## 2011-07-10 NOTE — Patient Instructions (Addendum)
[address]  07/10/2011   Your procedure is scheduled on:  07/21/11  Enter through the Main Entrance of [facility] at 1130 AM.  Pick up the phone at the desk and dial [phone].   Call this number if you have problems the morning of surgery: [phone]   Remember:   Do not eat food:After Midnight.  Do not drink clear liquids: after 9AM day of surgery  Take these medicines the morning of surgery with A SIP OF WATER: NA   Do not wear jewelry, make-up or nail polish.  Do not wear lotions, powders, or perfumes. You may wear deodorant.  Do not shave 48 hours prior to surgery.  Do not bring valuables to the hospital.  Contacts, dentures or bridgework may not be worn into surgery.  Leave suitcase in the car. After surgery it may be brought to your room.  For patients admitted to the hospital, checkout time is 11:00 AM the day of discharge.   Patients discharged the day of surgery will not be allowed to drive home.  Name and phone number of your driver: husband  Special Instructions: CHG Shower Use Special Wash: 1/2 bottle night before surgery and 1/2 bottle morning of surgery.   Please read over the following fact sheets that you were given: MRSA Information

## 2011-07-17 ENCOUNTER — Other Ambulatory Visit: Payer: Self-pay | Admitting: Obstetrics and Gynecology

## 2011-07-20 MED ORDER — CEFAZOLIN SODIUM-DEXTROSE 2-3 GM-% IV SOLR
2.0000 g | INTRAVENOUS | Status: AC
  Administered 2011-07-21: 2 g via INTRAVENOUS
  Filled 2011-07-20: qty 50

## 2011-07-21 ENCOUNTER — Encounter (HOSPITAL_COMMUNITY): Payer: Self-pay | Admitting: *Deleted

## 2011-07-21 ENCOUNTER — Encounter (HOSPITAL_COMMUNITY): Payer: Self-pay | Admitting: Anesthesiology

## 2011-07-21 ENCOUNTER — Inpatient Hospital Stay (HOSPITAL_COMMUNITY): Payer: 59 | Admitting: Anesthesiology

## 2011-07-21 ENCOUNTER — Encounter (HOSPITAL_COMMUNITY)
Admission: RE | Disposition: A | Discharge status: Home / Self Care | Payer: Self-pay | Source: Ambulatory Visit | Attending: Obstetrics and Gynecology

## 2011-07-21 ENCOUNTER — Inpatient Hospital Stay (HOSPITAL_COMMUNITY)
Admission: RE | Admit: 2011-07-21 | Discharge: 2011-07-23 | DRG: 743 | Disposition: A | Discharge status: Home / Self Care | Payer: 59 | Source: Ambulatory Visit | Attending: Obstetrics and Gynecology | Admitting: Obstetrics and Gynecology

## 2011-07-21 DIAGNOSIS — D279 Benign neoplasm of unspecified ovary: Secondary | ICD-10-CM | POA: Diagnosis present

## 2011-07-21 DIAGNOSIS — H6592 Unspecified nonsuppurative otitis media, left ear: Secondary | ICD-10-CM

## 2011-07-21 DIAGNOSIS — E876 Hypokalemia: Secondary | ICD-10-CM | POA: Diagnosis not present

## 2011-07-21 HISTORY — PX: OVARIAN CYST REMOVAL: SHX89

## 2011-07-21 HISTORY — PX: SALPINGOOPHORECTOMY: SHX82

## 2011-07-21 HISTORY — PX: LAPAROTOMY: SHX154

## 2011-07-21 LAB — BASIC METABOLIC PANEL
BUN: 7 mg/dL (ref 6–23)
CO2: 22 mEq/L (ref 19–32)
Chloride: 104 mEq/L (ref 96–112)
GFR calc Af Amer: >90 mL/min (ref >90)
Potassium: 3.2 mEq/L — ABNORMAL LOW (ref 3.5–5.1)

## 2011-07-21 SURGERY — LAPAROTOMY, EXPLORATORY
Anesthesia: General | Site: Abdomen | Laterality: Right | Wound class: Clean Contaminated

## 2011-07-21 MED ORDER — ONDANSETRON HCL 4 MG/2ML IJ SOLN: 4.0000 mg | Freq: Four times a day (QID) | INTRAMUSCULAR | Status: DC | PRN

## 2011-07-21 MED ORDER — BUPIVACAINE HCL (PF) 0.25 % IJ SOLN
INTRAMUSCULAR | Status: AC
  Filled 2011-07-21: qty 30

## 2011-07-21 MED ORDER — MIDAZOLAM HCL 2 MG/2ML IJ SOLN
INTRAMUSCULAR | Status: AC
  Filled 2011-07-21: qty 2

## 2011-07-21 MED ORDER — ONDANSETRON HCL 4 MG/2ML IJ SOLN
INTRAMUSCULAR | Status: AC
  Filled 2011-07-21: qty 2

## 2011-07-21 MED ORDER — PANTOPRAZOLE SODIUM 40 MG PO TBEC
40.0000 mg | DELAYED_RELEASE_TABLET | Freq: Every day | ORAL | Status: DC
  Filled 2011-07-21 (×3): qty 1

## 2011-07-21 MED ORDER — MIDAZOLAM HCL 5 MG/5ML IJ SOLN
INTRAMUSCULAR | Status: DC | PRN
  Administered 2011-07-21: 2 mg via INTRAVENOUS

## 2011-07-21 MED ORDER — KETOROLAC TROMETHAMINE 30 MG/ML IJ SOLN
30.0000 mg | Freq: Four times a day (QID) | INTRAMUSCULAR | Status: DC
  Administered 2011-07-22: 30 mg via INTRAVENOUS
  Filled 2011-07-21 (×2): qty 1

## 2011-07-21 MED ORDER — NALOXONE HCL 0.4 MG/ML IJ SOLN: 0.4000 mg | INTRAMUSCULAR | Status: DC | PRN

## 2011-07-21 MED ORDER — KCL-LACTATED RINGERS-D5W 20 MEQ/L IV SOLN
INTRAVENOUS | Status: DC
  Filled 2011-07-21 (×4): qty 1000

## 2011-07-21 MED ORDER — POTASSIUM CHLORIDE CRYS ER 10 MEQ PO TBCR
10.0000 meq | EXTENDED_RELEASE_TABLET | ORAL | Status: AC
  Administered 2011-07-21 – 2011-07-22 (×3): 10 meq via ORAL
  Filled 2011-07-21 (×3): qty 1

## 2011-07-21 MED ORDER — HYDROMORPHONE HCL PF 1 MG/ML IJ SOLN
0.2000 mg | INTRAMUSCULAR | Status: DC | PRN
  Administered 2011-07-21: 18:00:00 via INTRAVENOUS
  Filled 2011-07-21: qty 1

## 2011-07-21 MED ORDER — CEFAZOLIN SODIUM 1-5 GM-% IV SOLN
1.0000 g | Freq: Three times a day (TID) | INTRAVENOUS | Status: AC
  Administered 2011-07-21 – 2011-07-22 (×2): 1 g via INTRAVENOUS
  Filled 2011-07-21 (×2): qty 50

## 2011-07-21 MED ORDER — IBUPROFEN 800 MG PO TABS
800.0000 mg | ORAL_TABLET | Freq: Three times a day (TID) | ORAL | Status: DC | PRN
  Administered 2011-07-22 – 2011-07-23 (×2): 800 mg via ORAL
  Filled 2011-07-21 (×2): qty 1

## 2011-07-21 MED ORDER — DEXAMETHASONE SODIUM PHOSPHATE 4 MG/ML IJ SOLN
INTRAMUSCULAR | Status: DC | PRN
  Administered 2011-07-21: 5 mg via INTRAVENOUS

## 2011-07-21 MED ORDER — SODIUM CHLORIDE 0.9 % IJ SOLN: 9.0000 mL | INTRAMUSCULAR | Status: DC | PRN

## 2011-07-21 MED ORDER — ONDANSETRON HCL 4 MG PO TABS: 4.0000 mg | ORAL_TABLET | Freq: Four times a day (QID) | ORAL | Status: DC | PRN

## 2011-07-21 MED ORDER — FENTANYL CITRATE 0.05 MG/ML IJ SOLN
INTRAMUSCULAR | Status: AC
  Filled 2011-07-21: qty 5

## 2011-07-21 MED ORDER — FENTANYL CITRATE 0.05 MG/ML IJ SOLN
INTRAMUSCULAR | Status: DC | PRN
  Administered 2011-07-21: 50 ug via INTRAVENOUS
  Administered 2011-07-21: 100 ug via INTRAVENOUS
  Administered 2011-07-21 (×4): 50 ug via INTRAVENOUS
  Administered 2011-07-21: 100 ug via INTRAVENOUS
  Administered 2011-07-21: 50 ug via INTRAVENOUS

## 2011-07-21 MED ORDER — LIDOCAINE HCL (CARDIAC) 20 MG/ML IV SOLN
INTRAVENOUS | Status: DC | PRN
  Administered 2011-07-21: 50 mg via INTRAVENOUS

## 2011-07-21 MED ORDER — LIDOCAINE HCL (CARDIAC) 20 MG/ML IV SOLN
INTRAVENOUS | Status: AC
  Filled 2011-07-21: qty 5

## 2011-07-21 MED ORDER — POTASSIUM CHLORIDE 2 MEQ/ML IV SOLN
INTRAVENOUS | Status: DC
  Administered 2011-07-21 – 2011-07-22 (×2): via INTRAVENOUS
  Filled 2011-07-21 (×6): qty 1000

## 2011-07-21 MED ORDER — ONDANSETRON HCL 4 MG/2ML IJ SOLN
INTRAMUSCULAR | Status: DC | PRN
  Administered 2011-07-21: 4 mg via INTRAVENOUS

## 2011-07-21 MED ORDER — PROPOFOL 10 MG/ML IV EMUL
INTRAVENOUS | Status: DC | PRN
  Administered 2011-07-21: 150 mg via INTRAVENOUS
  Administered 2011-07-21: 50 mg via INTRAVENOUS

## 2011-07-21 MED ORDER — KETOROLAC TROMETHAMINE 30 MG/ML IJ SOLN: 30.0000 mg | Freq: Four times a day (QID) | INTRAMUSCULAR | Status: DC

## 2011-07-21 MED ORDER — KETOROLAC TROMETHAMINE 30 MG/ML IJ SOLN: 15.0000 mg | Freq: Once | INTRAMUSCULAR | Status: DC | PRN

## 2011-07-21 MED ORDER — NEOSTIGMINE METHYLSULFATE 1 MG/ML IJ SOLN
INTRAMUSCULAR | Status: DC | PRN
  Administered 2011-07-21: 3 mg via INTRAVENOUS

## 2011-07-21 MED ORDER — MENTHOL 3 MG MT LOZG: 1.0000 | LOZENGE | OROMUCOSAL | Status: DC | PRN

## 2011-07-21 MED ORDER — LACTATED RINGERS IV SOLN
INTRAVENOUS | Status: DC
  Administered 2011-07-21 (×3): via INTRAVENOUS

## 2011-07-21 MED ORDER — PROPOFOL 10 MG/ML IV EMUL
INTRAVENOUS | Status: AC
  Filled 2011-07-21: qty 20

## 2011-07-21 MED ORDER — ROCURONIUM BROMIDE 50 MG/5ML IV SOLN
INTRAVENOUS | Status: AC
  Filled 2011-07-21: qty 1

## 2011-07-21 MED ORDER — HYDROMORPHONE HCL PF 1 MG/ML IJ SOLN
0.2500 mg | INTRAMUSCULAR | Status: DC | PRN
  Administered 2011-07-21: 0.5 mg via INTRAVENOUS

## 2011-07-21 MED ORDER — ZOLPIDEM TARTRATE 5 MG PO TABS: 5.0000 mg | ORAL_TABLET | Freq: Every evening | ORAL | Status: DC | PRN

## 2011-07-21 MED ORDER — HYDROMORPHONE 0.3 MG/ML IV SOLN
INTRAVENOUS | Status: DC
  Administered 2011-07-21: 19:00:00 via INTRAVENOUS
  Administered 2011-07-22 (×2): 0.2 mg via INTRAVENOUS
  Filled 2011-07-21: qty 25

## 2011-07-21 MED ORDER — GLYCOPYRROLATE 0.2 MG/ML IJ SOLN
INTRAMUSCULAR | Status: DC | PRN
  Administered 2011-07-21: 0.6 mg via INTRAVENOUS

## 2011-07-21 MED ORDER — OXYCODONE-ACETAMINOPHEN 5-325 MG PO TABS
1.0000 | ORAL_TABLET | ORAL | Status: DC | PRN
  Administered 2011-07-22: 1 via ORAL
  Filled 2011-07-21 (×3): qty 1

## 2011-07-21 MED ORDER — KETOROLAC TROMETHAMINE 30 MG/ML IJ SOLN
INTRAMUSCULAR | Status: DC | PRN
  Administered 2011-07-21: 30 mg via INTRAVENOUS

## 2011-07-21 MED ORDER — DIPHENHYDRAMINE HCL 12.5 MG/5ML PO ELIX: 12.5000 mg | ORAL_SOLUTION | Freq: Four times a day (QID) | ORAL | Status: DC | PRN

## 2011-07-21 MED ORDER — DIPHENHYDRAMINE HCL 50 MG/ML IJ SOLN: 12.5000 mg | Freq: Four times a day (QID) | INTRAMUSCULAR | Status: DC | PRN

## 2011-07-21 MED ORDER — BUPIVACAINE HCL (PF) 0.25 % IJ SOLN
INTRAMUSCULAR | Status: DC | PRN
  Administered 2011-07-21: 4 mL

## 2011-07-21 MED ORDER — ROCURONIUM BROMIDE 100 MG/10ML IV SOLN
INTRAVENOUS | Status: DC | PRN
  Administered 2011-07-21: 40 mg via INTRAVENOUS

## 2011-07-21 MED ORDER — HYDROMORPHONE HCL PF 1 MG/ML IJ SOLN
INTRAMUSCULAR | Status: AC
  Administered 2011-07-21: 0.5 mg via INTRAVENOUS
  Filled 2011-07-21: qty 1

## 2011-07-21 SURGICAL SUPPLY — 35 items
BENZOIN TINCTURE PRP APPL 2/3 (GAUZE/BANDAGES/DRESSINGS) ×4 IMPLANT
CANISTER SUCTION 2500CC (MISCELLANEOUS) ×4 IMPLANT
CLOTH BEACON ORANGE TIMEOUT ST (SAFETY) ×4 IMPLANT
CONT SPEC PATH 64OZ SNAP LID (MISCELLANEOUS) ×4 IMPLANT
CONTAINER PREFILL 10% NBF 60ML (FORM) ×4 IMPLANT
DRAPE LAPAROTOMY T 102X78X121 (DRAPES) ×4 IMPLANT
DRSG COVADERM 4X10 (GAUZE/BANDAGES/DRESSINGS) ×4 IMPLANT
ELECT NEEDLE TIP 2.8 STRL (NEEDLE) ×4 IMPLANT
GLOVE BIO SURGEON STRL SZ 6.5 (GLOVE) ×4 IMPLANT
GLOVE BIO SURGEON STRL SZ7 (GLOVE) ×8 IMPLANT
GLOVE BIOGEL PI IND STRL 7.0 (GLOVE) ×6 IMPLANT
GLOVE BIOGEL PI INDICATOR 7.0 (GLOVE) ×2
GOWN PREVENTION PLUS LG XLONG (DISPOSABLE) ×12 IMPLANT
NEEDLE HYPO 25X1 1.5 SAFETY (NEEDLE) ×4 IMPLANT
NS IRRIG 1000ML POUR BTL (IV SOLUTION) ×8 IMPLANT
PACK ABDOMINAL GYN (CUSTOM PROCEDURE TRAY) ×4 IMPLANT
PAD OB MATERNITY 4.3X12.25 (PERSONAL CARE ITEMS) ×4 IMPLANT
SPONGE LAP 18X18 X RAY DECT (DISPOSABLE) ×4 IMPLANT
STRIP CLOSURE SKIN 1/2X4 (GAUZE/BANDAGES/DRESSINGS) ×4 IMPLANT
SUT PLAIN 2 0 (SUTURE) ×1
SUT PLAIN ABS 2-0 CT1 27XMFL (SUTURE) ×3 IMPLANT
SUT VIC AB 0 CT1 18XCR BRD8 (SUTURE) ×3 IMPLANT
SUT VIC AB 0 CT1 36 (SUTURE) ×8 IMPLANT
SUT VIC AB 0 CT1 8-18 (SUTURE) ×1
SUT VIC AB 2-0 CT1 27 (SUTURE) ×1
SUT VIC AB 2-0 CT1 TAPERPNT 27 (SUTURE) ×3 IMPLANT
SUT VIC AB 3-0 CT1 27 (SUTURE) ×6
SUT VIC AB 3-0 CT1 TAPERPNT 27 (SUTURE) ×18 IMPLANT
SUT VIC AB 3-0 SH 27 (SUTURE) ×1
SUT VIC AB 3-0 SH 27X BRD (SUTURE) ×3 IMPLANT
SUT VICRYL 4-0 PS2 18IN ABS (SUTURE) ×8 IMPLANT
SYR CONTROL 10ML LL (SYRINGE) ×4 IMPLANT
TOWEL OR 17X24 6PK STRL BLUE (TOWEL DISPOSABLE) ×8 IMPLANT
TRAY FOLEY CATH 14FR (SET/KITS/TRAYS/PACK) ×4 IMPLANT
WATER STERILE IRR 1000ML POUR (IV SOLUTION) ×4 IMPLANT

## 2011-07-21 NOTE — Transfer of Care (Signed)
Immediate Anesthesia Transfer of Care Note  Patient: Jessica Gallegos  Procedure(s) Performed: Procedure(s) (LRB): EXPLORATORY LAPAROTOMY (N/A) OVARIAN CYSTECTOMY (Right) SALPINGO OOPHERECTOMY (Left)  Patient Location: PACU  Anesthesia Type: General  Level of Consciousness: awake, alert  and oriented  Airway & Oxygen Therapy: Patient Spontanous Breathing and Patient connected to nasal cannula oxygen  Post-op Assessment: Report given to PACU RN and Post -op Vital signs reviewed and stable  Post vital signs: stable  Complications: No apparent anesthesia complications

## 2011-07-21 NOTE — Brief Op Note (Signed)
07/21/2011  3:11 PM  PATIENT:  Jessica Gallegos  31 y.o. female  PRE-OPERATIVE DIAGNOSIS:  Lower Left Quadrant pain, bilateral complex mass, desires sterilization  POST-OPERATIVE DIAGNOSIS:  Lower Left Quadrant pain, bilateral complex mass, desires sterilization  PROCEDURE:  Procedure(s) (LRB): EXPLORATORY LAPAROTOMY (N/A) OVARIAN CYSTECTOMY (Right), RIGHT SALPINGECTOMY SALPINGO OOPHERECTOMY (Left), PERITONEAL WASHINGS  SURGEON:  Surgeon(s) and Role:    * Ben Habermann A Brookley Spitler, MD - Primary  PHYSICIAN ASSISTANT:   ASSISTANTS: Arlan Organ, CNM   ANESTHESIA:   general Findings: right ovarian dermoid cyst, large left cystic mass w/ lock of hair noted, nl tubes bilaterally, right ovarian cyst, nl liver edge, no omentum lesion, nl uterus EBL:  Total I/O In: 2000 [I.V.:2000] Out: 350 [Urine:300; Blood:50]  BLOOD ADMINISTERED:none  DRAINS: none   LOCAL MEDICATIONS USED:  MARCAINE     SPECIMEN:  Source of Specimen:  LEFT TUBE AND OVARY, RIGHT TUBE, RIGHT OVARIAN CYST, PERITONEAL WASHINGS  DISPOSITION OF SPECIMEN:  PATHOLOGY  COUNTS:  YES  TOURNIQUET:  * No tourniquets in log *  DICTATION: .Other Dictation: Dictation Number [id number]  PLAN OF CARE: Admit to inpatient   PATIENT DISPOSITION:  PACU - hemodynamically stable.   Delay start of Pharmacological VTE agent (>24hrs) due to surgical blood loss or risk of bleeding: no

## 2011-07-21 NOTE — Anesthesia Postprocedure Evaluation (Signed)
Anesthesia Post Note  Patient: Jessica Gallegos  Procedure(s) Performed: Procedure(s) (LRB): EXPLORATORY LAPAROTOMY (N/A) OVARIAN CYSTECTOMY (Right) SALPINGO OOPHERECTOMY (Left)  Anesthesia type: General  Patient location: PACU  Post pain: Pain level controlled  Post assessment: Post-op Vital signs reviewed  Last Vitals:  Filed Vitals:   07/21/11 1730  BP: 112/66  Pulse: 90  Temp: 37.5 C  Resp: 16    Post vital signs: Reviewed  Level of consciousness: sedated  Complications: No apparent anesthesia complications.  Some PVCs intraop, which were not persistent and did not affect blood pressure, and then again in PACU.  Again not persistent, and did not affect BP.  Electrolytes checked - potassium mildly low.

## 2011-07-21 NOTE — Anesthesia Preprocedure Evaluation (Signed)
Anesthesia Evaluation  Patient identified by MRN, date of birth, ID band Patient awake    Reviewed: Allergy & Precautions, H&P , NPO status , Patient's Chart, lab work & pertinent test results, reviewed documented beta blocker date and time   History of Anesthesia Complications Negative for: history of anesthetic complications  Airway Mallampati: I TM Distance: >3 FB Neck ROM: full    Dental  (+)    Pulmonary neg pulmonary ROS,  breath sounds clear to auscultation  Pulmonary exam normal       Cardiovascular Exercise Tolerance: Good negative cardio ROS  Rhythm:regular Rate:Normal     Neuro/Psych  Headaches (migraines - only if eats pork), negative psych ROS   GI/Hepatic negative GI ROS, Neg liver ROS,   Endo/Other  negative endocrine ROS  Renal/GU negative Renal ROS  Female GU complaint     Musculoskeletal   Abdominal   Peds  Hematology negative hematology ROS (+)   Anesthesia Other Findings   Reproductive/Obstetrics negative OB ROS                           Anesthesia Physical Anesthesia Plan  ASA: I  Anesthesia Plan: General ETT   Post-op Pain Management:    Induction:   Airway Management Planned:   Additional Equipment:   Intra-op Plan:   Post-operative Plan:   Informed Consent: I have reviewed the patients History and Physical, chart, labs and discussed the procedure including the risks, benefits and alternatives for the proposed anesthesia with the patient or authorized representative who has indicated his/her understanding and acceptance.   Dental Advisory Given  Plan Discussed with: CRNA and Surgeon  Anesthesia Plan Comments:         Anesthesia Quick Evaluation

## 2011-07-22 LAB — CBC
HCT: 32.6 % — ABNORMAL LOW (ref 36.0–46.0)
Hemoglobin: 11 g/dL — ABNORMAL LOW (ref 12.0–15.0)
MCH: 30.9 pg (ref 26.0–34.0)
MCHC: 33.7 g/dL (ref 30.0–36.0)
RDW: 12.7 % (ref 11.5–15.5)

## 2011-07-22 NOTE — Progress Notes (Signed)
Subjective: Patient reports incisional pain, tolerating PO and no problems voiding.    Objective: I have reviewed patient's vital signs.  vital signs, intake and output and labs. Filed Vitals:   07/22/11 0548  BP: 97/60  Pulse: 67  Temp: 98.2 F (36.8 C)  Resp: 14   I/O last 3 completed shifts: In: 2800 [I.V.:2800] Out: 450 [Urine:400; Blood:50] Total I/O In: 1225.3 [I.V.:1225.3] Out: 1800 [Urine:1800]  Lab Results  Component Value Date   WBC 9.3 07/22/2011   HGB 11.0* 07/22/2011   HCT 32.6* 07/22/2011   MCV 91.6 07/22/2011   PLT 200 07/22/2011   Lab Results  Component Value Date   CREATININE 0.63 07/21/2011    EXAM General: alert, cooperative and no distress Resp: clear to auscultation bilaterally Cardio: regular rate and rhythm, S1, S2 normal, no murmur, click, rub or gallop GI: soft, non-tender; bowel sounds normal; no masses,  no organomegaly and incision: clean, dry and w/ steristrip. primary dressing taken off dried blood noted Extremities: no edema, redness or tenderness in the calves or thighs Vaginal Bleeding: cycle  Assessment: s/p Procedure(s): EXPLORATORY LAPAROTOMY OVARIAN CYSTECTOMY SALPINGO OOPHERECTOMY: stable and progressing well  Plan: Advance diet Discontinue IV fluids ambulate  LOS: 1 day    Archimedes Harold A, MD 07/22/2011 6:35 AM    07/22/2011, 6:35 AM

## 2011-07-22 NOTE — Op Note (Signed)
NAME:  Jessica Gallegos, Jessica Gallegos                  ACCOUNT NO.:  [id number]  MEDICAL RECORD NO.:  [id number]  LOCATION:  9306                          FACILITY:  [facility]  PHYSICIAN:  Maxie Better, M.D.DATE OF BIRTH:  11/17/1980  DATE OF PROCEDURE: DATE OF DISCHARGE:                              OPERATIVE REPORT   PREOPERATIVE DIAGNOSES:  Right ovarian dermoid cyst, desires sterilization, and large left complex ovarian cyst.  PROCEDURES:  Exploratory laparotomy, left salpingo-oophorectomy, right salpingectomy,  right ovarian cystectomy, and pelvic washings.  POSTOPERATIVE DIAGNOSES:  Right ovarian dermoid cyst, desires sterilization, large left complex ovarian cyst, and question of the dermoid with a mucinous component.  ANESTHESIA:  General.  SURGEON:  Maxie Better, M.D.  ASSISTANT:  Arlan Organ, CNM  DESCRIPTION OF PROCEDURE:  Under adequate general anesthesia, the patient was placed in the supine position.  Examination under anesthesia revealed a mobile mass arising to the level of the patient's umbilicus. Bimanual examination revealed a small uterus.  The right ovary was difficult to palpate.  The patient was sterilely prepped and draped in usual fashion.  An indwelling Foley catheter was sterilely placed.  A 0.25% percent Marcaine was injected along the planned vertical skin incision.  The vertical skin incision was then made, carried down to the rectus fascia, which was opened superiorly and inferiorly.  The parietal peritoneum was opened in midline and 500 mL of fluid was instilled in the abdomen, subsequently 250 mL was aspirated and sent for washings.  A large mass with smooth glistening Lamke surface was noted arising from the left ovary, which was totally replaced by the mass.  The left fallopian tube was normal.  The left ureter was seen peristalsing deep in the pelvis.  The uterus was normal.  The right ovary contained about 5 cm cystic mass.  Right tube was normal.   The mass was exteriorized on the left and untwisted.  The retroperitoneum was opened on the left. The left ovarian vessels were doubly clamped, cut, free tied with 0 Vicryl x2 proximally and distally.  Subsequently serial clamping of the left utero- ovarian ligament, and the mesovarian area was serially clamped, cut, and suture ligated with 3- 0 Vicryl sutures until the left tube and ovary was removed in total and intact.  Additional 3-0 Vicryl suture was placed the small bleeding at the junction where the tube and ovary was removed on the left.  Attention was turned to the right side.  The right fallopian tube was grasped with a Babcock.  Mesosalpinx was serially clamped, cut, suture ligated with 3-0 Vicryl until the fallopian tube on the right was removed.  The right ovary was then exteriorized and using the needlepoint cautery, a linear incision was made overlying the cystic mass.  Blunt dissection was then performed, incidental rupture occurred with sebaceous material noted.  Subsequently the cyst wall was removed in total.  A small bleeders were cauterized, and the defect was closed with rosette of 3-0 Vicryl suture until the surface was reached at which time 3-0 Vicryl baseball stitch was then performed.  The appendix was normal.  Normal liver edge and normal palpable kidneys were noted.  The abdomen was  then copiously irrigated again and suctioned all debris. Good hemostasis noted on both sides.  The parietal peritoneum was then closed with 2-0 Vicryl.  The rectus fascia closed with 0 Vicryl x2.  The subcutaneous area was irrigated, small bleeders cauterized.  Interrupted 2-0 plain sutures placed subcutaneous area and the skin approximated with 4-0 Vicryl subcuticular stitch.  Specimen was the left tube and ovary, pelvic washings, right tube and right ovarian cyst were all sent to Pathology.  ESTIMATED BLOOD LOSS: 50 mL.  INTRAOPERATIVE FLUID:  2 L.  URINE OUTPUT:  300 mL of  clear urine.  COUNTS:  Sponge and instrument counts x2 was correct.  COMPLICATIONS:  None.  DISPOSITION:  The patient tolerated the procedure well was transferred to recovery in stable condition.     Maxie Better, M.D.     /MEDQ  D:  07/21/2011  T:  07/22/2011  Job:  [id number]

## 2011-07-23 DIAGNOSIS — D279 Benign neoplasm of unspecified ovary: Secondary | ICD-10-CM | POA: Diagnosis present

## 2011-07-23 MED ORDER — IBUPROFEN 800 MG PO TABS: 800.0000 mg | ORAL_TABLET | Freq: Three times a day (TID) | ORAL | Status: AC | PRN

## 2011-07-23 MED ORDER — POTASSIUM CHLORIDE CRYS ER 20 MEQ PO TBCR
40.0000 meq | EXTENDED_RELEASE_TABLET | Freq: Two times a day (BID) | ORAL | Status: DC
Start: 2011-07-23 — End: 2011-07-23
  Filled 2011-07-23: qty 2

## 2011-07-23 MED ORDER — POTASSIUM CHLORIDE CRYS ER 20 MEQ PO TBCR: 40.0000 meq | EXTENDED_RELEASE_TABLET | Freq: Two times a day (BID) | ORAL | Status: DC

## 2011-07-23 NOTE — Discharge Summary (Signed)
Physician Discharge Summary  Patient ID: Jessica Gallegos MRN: [id number] DOB/AGE: [birth year] 31 y.o.  Admit date: 07/21/2011 Discharge date: 07/23/2011  Admission Diagnoses: desires sterilization, large left adnexal mass, right dermoid cyst  Discharge Diagnoses: desires sterilization, right dermoid cyst, left adnexal cyst, hypokalemia Active Problems:  Dermoid cyst of ovary   Discharged Condition: stable  Hospital Course: uncomplicated postop course. S/p exp laparotomy, LSO, right ovarian cystectomy, right salpingectomy  Consults: None  Significant Diagnostic Studies: labs: hgb 11.0, hct 32.6  , wbc 9.3  plt count 200K  Treatments: surgery: exp laparotomy, LSO, right ovarian cystectomy, right salpingectomy  Discharge Exam: Blood pressure 97/68, pulse 76, temperature 99.2 F (37.3 C), temperature source Oral, resp. rate 16, last menstrual period 07/19/2011, SpO2 99.00%. General appearance: alert, cooperative and no distress Resp: clear to auscultation bilaterally Breasts: normal appearance, no masses or tenderness Cardio: regular rate and rhythm, S1, S2 normal, no murmur, click, rub or gallop GI: soft, non-tender; bowel sounds normal; no masses,  no organomegaly Extremities: no edema, redness or tenderness in the calves or thighs Incision/Wound:vertical incision (+) steristrip  Disposition: 01-Home or Self Care  Discharge Orders    Future Orders Please Complete By Expires   Diet general      Increase activity slowly      Discharge instructions      Comments:   Call if temperature greater than equal to 100.4, nothing per vagina for 4-6 weeks or severe nausea vomiting, increased incisional pain , drainage or redness in the incision site,   No wound care      Discharge patient        Medication List  As of 07/23/2011  8:09 AM   STOP taking these medications         medroxyPROGESTERone 150 MG/ML injection         TAKE these medications         ibuprofen 800 MG tablet   Commonly known as: ADVIL,MOTRIN   Take 1 tablet (800 mg total) by mouth every 8 (eight) hours as needed (mild pain).           Follow-up Information    Follow up with Jessica Gallegos A, MD in 4 weeks.   Contact information:   [address] [place] [address] [phone]          Signed: [name] 07/23/2011, 8:09 AM

## 2011-07-23 NOTE — Discharge Instructions (Signed)
Call if temperature greater than equal to 100.4, nothing per vagina for 4-6 weeks or severe nausea vomiting, increased incisional pain , drainage or redness in the incision site,

## 2011-07-23 NOTE — Progress Notes (Signed)
Pt d/c home, ambulatory with family to private car. D/C instructions and prescriptions reviewed with pt. Pt verbalized understanding.

## 2011-07-23 NOTE — Progress Notes (Signed)
S: feels well ready for home. tol reg diet. Declines narcotic pain med  O: VSS afebrile Lungs clear to A  Cor: RRR Abd soft protuberant active BS. Incision vertical with steristrips. Well approximated (-)erythema/induration or exudate  Pad: cycle Extr: no edema or calf tenderness  IMP: stable postop S/P ex lap, LSO, right ov cystectomy, right salpingectomy  P) d/c home. D./c instructions reviewed. Motrin script only f/u 4 wk

## 2011-07-24 ENCOUNTER — Encounter (HOSPITAL_COMMUNITY): Payer: Self-pay | Admitting: Obstetrics and Gynecology

## 2011-07-24 MED FILL — Heparin Sodium (Porcine) Inj 5000 Unit/ML: INTRAMUSCULAR | Qty: 1 | Status: AC

## 2011-07-24 NOTE — Progress Notes (Signed)
UR chart review completed.  

## 2011-07-28 ENCOUNTER — Encounter (HOSPITAL_COMMUNITY): Payer: Self-pay

## 2013-12-05 ENCOUNTER — Encounter: Payer: Self-pay | Admitting: Gastroenterology

## 2013-12-15 ENCOUNTER — Encounter: Payer: Self-pay | Admitting: *Deleted

## 2013-12-19 ENCOUNTER — Encounter: Payer: Self-pay | Admitting: Gastroenterology

## 2013-12-19 ENCOUNTER — Ambulatory Visit (INDEPENDENT_AMBULATORY_CARE_PROVIDER_SITE_OTHER): Payer: 59 | Admitting: Gastroenterology

## 2013-12-19 VITALS — BP 105/60 | HR 89 | Ht 68.0 in | Wt 107.8 lb

## 2013-12-19 DIAGNOSIS — R197 Diarrhea, unspecified: Secondary | ICD-10-CM | POA: Insufficient documentation

## 2013-12-19 NOTE — Patient Instructions (Signed)
You may take Imodium for diarrhea as needed. Follow directions on bottle.  Do not exceed recommended dosage.  Follow up as needed. Call us at [phone].

## 2013-12-19 NOTE — Progress Notes (Signed)
12/19/2013 Jessica Gallegos [id number] 1980-07-17   HISTORY OF PRESENT ILLNESS:  This is a pleasant 33 year old female who is new to our practice and was referred here by her GYN, Dr. Garwin Brothers, for evaluation regarding diarrhea.  The patient states that the diarrhea has been present for the past 2-3 years.  She ONLY has diarrhea when she eats out at [employer]; she NEVER has diarrhea when she eats at home.  Usually when she goes out she will eat a burger with fries, but it happens with shrimp, etc as well.  She says that as soon as she eats she is running to the bathroom with diarrhea multiple times.  Denies every seeing blood in her stools.  Gets cramping abdominal pain with the diarrhea, but resolves when the diarrhea resolves.  She says that she eats whatever she wants at home including drinking milk, etc.  CBC, CMP, and TSH were WNL's.  Never had this problem prior to 2-3 year ago.   Past Medical History  Diagnosis Date  . Migraine   . History of chicken pox   . Postpartum care following vaginal delivery (9/29) 12/03/2010  . Maternal iron deficiency anemia complicating pregnancy 1/74/0814  . Acute blood loss anemia 12/04/2010   Past Surgical History  Procedure Laterality Date  . Dilation and currettage x2    . Dilation and curettage of uterus    . Laparotomy  07/21/2011    Procedure: EXPLORATORY LAPAROTOMY;  Surgeon: Marvene Staff, MD;  Location: [facility];  Service: Gynecology;  Laterality: N/A;  with tubal ligation  . Ovarian cyst removal  07/21/2011    Procedure: OVARIAN CYSTECTOMY;  Surgeon: Marvene Staff, MD;  Location: [facility];  Service: Gynecology;  Laterality: Right;  Right ovarian cystectomy.  . Salpingoophorectomy  07/21/2011    Procedure: SALPINGO OOPHERECTOMY;  Surgeon: Marvene Staff, MD;  Location: [facility];  Service: Gynecology;  Laterality: Left;    reports that she has never smoked. She has never used smokeless tobacco. She reports that she does not drink  alcohol or use illicit drugs. family history is not on file. No Known Allergies    Outpatient Encounter Prescriptions as of 12/19/2013  Medication Sig  . [DISCONTINUED] potassium chloride SA (K-DUR,KLOR-CON) 20 MEQ tablet Take 2 tablets (40 mEq total) by mouth 2 (two) times daily.     REVIEW OF SYSTEMS  : All other systems reviewed and negative except where noted in the History of Present Illness.   PHYSICAL EXAM: BP 105/60  Pulse 89  Ht 5\' 8"  (1.727 m)  Wt 107 lb 12.8 oz (48.898 kg)  BMI 16.39 kg/m2  SpO2 99% General: Well developed black female in no acute distress Head: Normocephalic and atraumatic Eyes:  Sclerae anicteric, conjunctiva pink. Ears: Normal auditory acuity Lungs: Clear throughout to auscultation Heart: Regular rate and rhythm Abdomen: Soft, non-distended.  Normal bowel sounds.  Non-tender. Musculoskeletal: Symmetrical with no gross deformities  Skin: No lesions on visible extremities Extremities: No edema  Neurological: Alert oriented x 4, grossly non-focal Psychological:  Alert and cooperative. Normal mood and affect  ASSESSMENT AND PLAN: -Diarrhea, chronic:  Only occurs when she eats in a restaurant; NEVER occurs at home.  Unsure the cause of this issue other than if she has an intolerance to the oils, etc that restaurants use to cook their foods.  I offered celiac testing and colonoscopic evaluation, but patient declined for now.  She is going to do a trial at  home, cooking with different oils and see if she can identify an offending product.  I have advised her to try Imodium prn when she goes out to eat at a restaurant to see if that helps.

## 2013-12-29 NOTE — Progress Notes (Signed)
Agree with Ms. Zehr's management.  Eline Geng E. Izabellah Dadisman, MD, FACG  

## 2014-01-05 ENCOUNTER — Encounter: Payer: Self-pay | Admitting: Gastroenterology

## 2014-04-15 ENCOUNTER — Emergency Department (HOSPITAL_COMMUNITY)
Admission: EM | Admit: 2014-04-15 | Discharge: 2014-04-15 | Disposition: A | Discharge status: Home / Self Care | Payer: 59 | Attending: Emergency Medicine | Admitting: Emergency Medicine

## 2014-04-15 ENCOUNTER — Encounter (HOSPITAL_COMMUNITY): Payer: Self-pay | Admitting: Emergency Medicine

## 2014-04-15 DIAGNOSIS — Z23 Encounter for immunization: Secondary | ICD-10-CM | POA: Diagnosis not present

## 2014-04-15 DIAGNOSIS — S81852A Open bite, left lower leg, initial encounter: Secondary | ICD-10-CM | POA: Insufficient documentation

## 2014-04-15 DIAGNOSIS — Z8679 Personal history of other diseases of the circulatory system: Secondary | ICD-10-CM | POA: Insufficient documentation

## 2014-04-15 DIAGNOSIS — Z862 Personal history of diseases of the blood and blood-forming organs and certain disorders involving the immune mechanism: Secondary | ICD-10-CM | POA: Diagnosis not present

## 2014-04-15 DIAGNOSIS — Y9289 Other specified places as the place of occurrence of the external cause: Secondary | ICD-10-CM | POA: Diagnosis not present

## 2014-04-15 DIAGNOSIS — Z8619 Personal history of other infectious and parasitic diseases: Secondary | ICD-10-CM | POA: Diagnosis not present

## 2014-04-15 DIAGNOSIS — Y998 Other external cause status: Secondary | ICD-10-CM | POA: Insufficient documentation

## 2014-04-15 DIAGNOSIS — Y9389 Activity, other specified: Secondary | ICD-10-CM | POA: Insufficient documentation

## 2014-04-15 DIAGNOSIS — W540XXA Bitten by dog, initial encounter: Secondary | ICD-10-CM | POA: Diagnosis not present

## 2014-04-15 MED ORDER — TETANUS-DIPHTH-ACELL PERTUSSIS 5-2.5-18.5 LF-MCG/0.5 IM SUSP
0.5000 mL | Freq: Once | INTRAMUSCULAR | Status: AC
  Administered 2014-04-15: 0.5 mL via INTRAMUSCULAR
  Filled 2014-04-15: qty 0.5

## 2014-04-15 MED ORDER — RABIES IMMUNE GLOBULIN 150 UNIT/ML IM INJ
20.0000 [IU]/kg | INJECTION | Freq: Once | INTRAMUSCULAR | Status: AC
  Administered 2014-04-15: 975 [IU]
  Filled 2014-04-15: qty 8

## 2014-04-15 MED ORDER — AMOXICILLIN-POT CLAVULANATE 875-125 MG PO TABS: 1.0000 | ORAL_TABLET | Freq: Two times a day (BID) | ORAL | Status: AC

## 2014-04-15 MED ORDER — LIDOCAINE-EPINEPHRINE (PF) 2 %-1:200000 IJ SOLN: 10.0000 mL | Freq: Once | INTRAMUSCULAR | Status: DC

## 2014-04-15 MED ORDER — RABIES VACCINE, PCEC IM SUSR
1.0000 mL | Freq: Once | INTRAMUSCULAR | Status: AC
  Administered 2014-04-15: 1 mL via INTRAMUSCULAR
  Filled 2014-04-15: qty 1

## 2014-04-15 NOTE — ED Notes (Signed)
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 Pt does not have any information regarding the animal.

## 2014-04-15 NOTE — ED Provider Notes (Signed)
CSN: [id number]     Arrival date & time 04/15/14  1824 History   First MD Initiated Contact with Patient 04/15/14 1834     Chief Complaint  Patient presents with  . 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 Patient reports she did not notice the dog, despite the dog being on a leash, and states it bit her in the left lower leg. The dog officer was called along with animal control. Unknown rabies status on dog, and patient was advised to come to the emergency department for rabies prophylaxis and a tetanus shot. She is unsure of her last tetanus shot. She reports pain only when pressing on the area where she was bit. No bleeding.  Patient is a 34 y.o. female presenting with animal bite. The history is provided by the patient.  Animal Bite Associated symptoms: no fever     Past Medical History  Diagnosis Date  . Migraine   . History of chicken pox   . Postpartum care following vaginal delivery (9/29) 12/03/2010  . Maternal iron deficiency anemia complicating pregnancy 07/06/7739  . Acute blood loss anemia 12/04/2010   Past Surgical History  Procedure Laterality Date  . Dilation and currettage x2    . Dilation and curettage of uterus    . Laparotomy  07/21/2011    Procedure: EXPLORATORY LAPAROTOMY;  Surgeon: Marvene Staff, MD;  Location: [facility];  Service: Gynecology;  Laterality: N/A;  with tubal ligation  . Ovarian cyst removal  07/21/2011    Procedure: OVARIAN CYSTECTOMY;  Surgeon: Marvene Staff, MD;  Location: [facility];  Service: Gynecology;  Laterality: Right;  Right ovarian cystectomy.  . Salpingoophorectomy  07/21/2011    Procedure: SALPINGO OOPHERECTOMY;  Surgeon: Marvene Staff, MD;  Location: [facility];  Service: Gynecology;  Laterality: Left;   No family history on file. History  Substance  Use Topics  . Smoking status: Never Smoker   . Smokeless tobacco: Never Used  . Alcohol Use: No   OB History    Gravida Para Term Preterm AB TAB SAB Ectopic Multiple Living   4 2 2  2 2    2      Review of Systems  Constitutional: Negative for fever.  Skin: Positive for wound.      Allergies  Review of patient's allergies indicates no known allergies.  Home Medications   Prior to Admission medications   Medication Sig Start Date End Date Taking? Authorizing Provider  amoxicillin-clavulanate (AUGMENTIN) 875-125 MG per tablet Take 1 tablet by mouth 2 (two) times daily. One po bid x 7 days 04/15/14   Lori Popowski M Nereyda Bowler, PA-C   BP 103/62 mmHg  Pulse 67  Temp(Src) 98.5 F (36.9 C) (Oral)  Resp 16  Wt 108 lb 0.4 oz (49 kg)  SpO2 100%  LMP 03/20/2014 Physical Exam  Constitutional: She is oriented to person, place, and time. She appears well-developed and well-nourished. No distress.  HENT:  Head: Normocephalic and atraumatic.  Mouth/Throat: Oropharynx is clear and moist.  Eyes: Conjunctivae and EOM are normal.  Neck: Normal range of motion. Neck supple.  Cardiovascular: Normal rate, regular rhythm and normal heart sounds.   Pulmonary/Chest: Effort normal and breath sounds normal. No respiratory distress.  Musculoskeletal: Normal range of motion.       Legs: Neurological: She is alert and oriented to person, place, and time. No sensory deficit.  Skin: Skin is warm and dry.  Psychiatric: She has a normal mood and affect. Her behavior is normal.  Nursing note and vitals reviewed.   ED Course  Procedures (including critical care time) Labs Review Labs Reviewed - No data to display  Imaging Review No results found.   EKG Interpretation None      MDM   Final diagnoses:  Dog bite of left lower leg, initial encounter   NAD, AFVSS. Puncture wounds very superficial. No bleeding. Tetanus updated. Given unknown vaccination status of dog, rabies series started. Start patient  on Augmentin. Stable for discharge. Return precautions given. Patient states understanding of treatment care plan and is agreeable.  Carman Ching, PA-C 04/15/14 2006  Hoy Morn, MD 04/16/14 [phone]

## 2014-04-15 NOTE — ED Notes (Signed)
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

## 2014-04-15 NOTE — Discharge Instructions (Signed)
Take augmentin twice daily for 1 week.  Animal Bite An animal bite can result in a scratch on the skin, deep open cut, puncture of the skin, crush injury, or tearing away of the skin or a body part. Dogs are responsible for most animal bites. Children are bitten more often than adults. An animal bite can range from very mild to more serious. A small bite from your house pet is no cause for alarm. However, some animal bites can become infected or injure a bone or other tissue. You must seek medical care if:  The skin is broken and bleeding does not slow down or stop after 15 minutes.  The puncture is deep and difficult to clean (such as a cat bite).  Pain, warmth, redness, or pus develops around the wound.  The bite is from a stray animal or rodent. There may be a risk of rabies infection.  The bite is from a snake, raccoon, skunk, fox, coyote, or bat. There may be a risk of rabies infection.  The person bitten has a chronic illness such as diabetes, liver disease, or cancer, or the person takes medicine that lowers the immune system.  There is concern about the location and severity of the bite. It is important to clean and protect an animal bite wound right away to prevent infection. Follow these steps:  Clean the wound with plenty of water and soap.  Apply an antibiotic cream.  Apply gentle pressure over the wound with a clean towel or gauze to slow or stop bleeding.  Elevate the affected area above the heart to help stop any bleeding.  Seek medical care. Getting medical care within 8 hours of the animal bite leads to the best possible outcome. DIAGNOSIS  Your caregiver will most likely:  Take a detailed history of the animal and the bite injury.  Perform a wound exam.  Take your medical history. Blood tests or X-rays may be performed. Sometimes, infected bite wounds are cultured and sent to a lab to identify the infectious bacteria.  TREATMENT  Medical treatment will depend  on the location and type of animal bite as well as the patient's medical history. Treatment may include:  Wound care, such as cleaning and flushing the wound with saline solution, bandaging, and elevating the affected area.  Antibiotics.  Tetanus immunization.  Rabies immunization.  Leaving the wound open to heal. This is often done with animal bites, due to the high risk of infection. However, in certain cases, wound closure with stitches, wound adhesive, skin adhesive strips, or staples may be used. Infected bites that are left untreated may require intravenous (IV) antibiotics and surgical treatment in the hospital. [place]  Follow your caregiver's instructions for wound care.  Take all medicines as directed.  If your caregiver prescribes antibiotics, take them as directed. Finish them even if you start to feel better.  Follow up with your caregiver for further exams or immunizations as directed. You may need a tetanus shot if:  You cannot remember when you had your last tetanus shot.  You have never had a tetanus shot.  The injury broke your skin. If you get a tetanus shot, your arm may swell, get red, and feel warm to the touch. This is common and not a problem. If you need a tetanus shot and you choose not to have one, there is a rare chance of getting tetanus. Sickness from tetanus can be serious. SEEK MEDICAL CARE IF:  You notice warmth,  redness, soreness, swelling, pus discharge, or a bad smell coming from the wound.  You have a red line on the skin coming from the wound.  You have a fever, chills, or a general ill feeling.  You have nausea or vomiting.  You have continued or worsening pain.  You have trouble moving the injured part.  You have other questions or concerns. MAKE SURE YOU:  Understand these instructions.  Will watch your condition.  Will get help right away if you are not doing well or get worse. Document Released: 11/08/2010  Document Revised: 05/15/2011 Document Reviewed: 11/08/2010 [facility] Patient Information 2015 [place], [place]. This information is not intended to replace advice given to you by your health care provider. Make sure you discuss any questions you have with your health care provider.

## 2014-04-22 ENCOUNTER — Encounter (HOSPITAL_COMMUNITY): Payer: Self-pay | Admitting: *Deleted

## 2014-04-22 ENCOUNTER — Emergency Department (INDEPENDENT_AMBULATORY_CARE_PROVIDER_SITE_OTHER)
Admission: EM | Admit: 2014-04-22 | Discharge: 2014-04-22 | Disposition: A | Discharge status: Home / Self Care | Payer: 59 | Source: Home / Self Care

## 2014-04-22 ENCOUNTER — Emergency Department (HOSPITAL_COMMUNITY)
Admission: EM | Admit: 2014-04-22 | Discharge: 2014-04-22 | Disposition: A | Discharge status: Home / Self Care | Payer: Self-pay

## 2014-04-22 DIAGNOSIS — Z203 Contact with and (suspected) exposure to rabies: Secondary | ICD-10-CM

## 2014-04-22 MED ORDER — RABIES VACCINE, PCEC IM SUSR
INTRAMUSCULAR | Status: AC
  Filled 2014-04-22: qty 1

## 2014-04-22 MED ORDER — RABIES VACCINE, PCEC IM SUSR
1.0000 mL | Freq: Once | INTRAMUSCULAR | Status: AC
  Administered 2014-04-22: 1 mL via INTRAMUSCULAR

## 2014-04-22 NOTE — ED Notes (Signed)
2nd rabies vaccine for dog bite to L lower leg.  Scabbed over.  No signs of infection.  No side effects from vaccine.

## 2014-04-22 NOTE — Discharge Instructions (Signed)
Call if any problems.  Return 2/20 for next vaccine

## 2014-04-22 NOTE — ED Notes (Signed)
Pt. Given an incorrect schedule in the ED.  She said they told her come back on 3/21 for the last shot- and was only to get 2 shots.  I told her that she needs 3 vaccines. Day 3 should have been 2/13, day 7- 2/17 and day 14 2/24.  Pt. instructed to come back on Sat. to get back on schedule to get all vaccines in a 14 day period with minimum of 3 days apart.  Pt. Voiced understanding.

## 2014-04-25 ENCOUNTER — Emergency Department (INDEPENDENT_AMBULATORY_CARE_PROVIDER_SITE_OTHER)
Admission: EM | Admit: 2014-04-25 | Discharge: 2014-04-25 | Disposition: A | Discharge status: Home / Self Care | Payer: 59 | Source: Home / Self Care | Attending: Family Medicine | Admitting: Family Medicine

## 2014-04-25 ENCOUNTER — Encounter (HOSPITAL_COMMUNITY): Payer: Self-pay | Admitting: Emergency Medicine

## 2014-04-25 DIAGNOSIS — Z203 Contact with and (suspected) exposure to rabies: Secondary | ICD-10-CM

## 2014-04-25 MED ORDER — IBUPROFEN 800 MG PO TABS
800.0000 mg | ORAL_TABLET | Freq: Once | ORAL | Status: AC
  Administered 2014-04-25: 800 mg via ORAL

## 2014-04-25 MED ORDER — IBUPROFEN 800 MG PO TABS
ORAL_TABLET | ORAL | Status: AC
  Filled 2014-04-25: qty 1

## 2014-04-25 MED ORDER — RABIES VACCINE, PCEC IM SUSR
INTRAMUSCULAR | Status: AC
Start: 2014-04-25 — End: 2014-04-25
  Filled 2014-04-25: qty 1

## 2014-04-25 MED ORDER — RABIES VACCINE, PCEC IM SUSR
1.0000 mL | Freq: Once | INTRAMUSCULAR | Status: AC
Start: 2014-04-25 — End: 2014-04-25
  Administered 2014-04-25: 1 mL via INTRAMUSCULAR

## 2014-04-25 NOTE — ED Notes (Signed)
Pt states that after her 2nd rabies vaccine she developed chills and body aches the next day. Called inpatient pharmacy and made them aware of pt symptoms after 2nd vaccine. They recommended pre-medicating pt. Per Allena Katz PA we will be giving pt Motrin 800mg  prior to vaccine.

## 2014-04-25 NOTE — Discharge Instructions (Signed)
Return on 05/02/2014 for next rabies Vaccine

## 2014-04-25 NOTE — ED Notes (Signed)
Pt is here for her 3rd series of rabies vaccine.

## 2014-05-03 ENCOUNTER — Emergency Department (INDEPENDENT_AMBULATORY_CARE_PROVIDER_SITE_OTHER)
Admission: EM | Admit: 2014-05-03 | Discharge: 2014-05-03 | Disposition: A | Discharge status: Home / Self Care | Payer: 59 | Source: Home / Self Care

## 2014-05-03 ENCOUNTER — Encounter (HOSPITAL_COMMUNITY): Payer: Self-pay | Admitting: Emergency Medicine

## 2014-05-03 DIAGNOSIS — Z203 Contact with and (suspected) exposure to rabies: Secondary | ICD-10-CM

## 2014-05-03 MED ORDER — RABIES VACCINE, PCEC IM SUSR
1.0000 mL | Freq: Once | INTRAMUSCULAR | Status: AC
  Administered 2014-05-03: 1 mL via INTRAMUSCULAR

## 2014-05-03 MED ORDER — RABIES VACCINE, PCEC IM SUSR
INTRAMUSCULAR | Status: AC
  Filled 2014-05-03: qty 1

## 2014-05-03 NOTE — Discharge Instructions (Signed)
Follow up as needed

## 2014-05-03 NOTE — ED Notes (Signed)
Patient in department for final vaccine.

## 2017-12-13 ENCOUNTER — Other Ambulatory Visit: Payer: Self-pay

## 2017-12-13 ENCOUNTER — Encounter (HOSPITAL_COMMUNITY): Payer: Self-pay | Admitting: *Deleted

## 2017-12-13 ENCOUNTER — Emergency Department (HOSPITAL_COMMUNITY)
Admission: EM | Admit: 2017-12-13 | Discharge: 2017-12-13 | Disposition: A | Discharge status: Home / Self Care | Payer: 59 | Attending: Emergency Medicine | Admitting: Emergency Medicine

## 2017-12-13 DIAGNOSIS — R42 Dizziness and giddiness: Secondary | ICD-10-CM | POA: Diagnosis present

## 2017-12-13 DIAGNOSIS — H8112 Benign paroxysmal vertigo, left ear: Secondary | ICD-10-CM | POA: Diagnosis not present

## 2017-12-13 LAB — CBC WITH DIFFERENTIAL/PLATELET
ABS IMMATURE GRANULOCYTES: 0.02 10*3/uL (ref 0.00–0.07)
Basophils Absolute: 0 10*3/uL (ref 0.0–0.1)
Basophils Relative: 0 %
EOS ABS: 0 10*3/uL (ref 0.0–0.5)
EOS PCT: 0 %
HCT: 40 % (ref 36.0–46.0)
Hemoglobin: 12.7 g/dL (ref 12.0–15.0)
Immature Granulocytes: 0 %
Lymphocytes Relative: 23 %
Lymphs Abs: 1.2 10*3/uL (ref 0.7–4.0)
MCH: 30.5 pg (ref 26.0–34.0)
MCHC: 31.8 g/dL (ref 30.0–36.0)
MCV: 96.2 fL (ref 80.0–100.0)
MONOS PCT: 7 %
Monocytes Absolute: 0.4 10*3/uL (ref 0.1–1.0)
NEUTROS ABS: 3.6 10*3/uL (ref 1.7–7.7)
Neutrophils Relative %: 70 %
Platelets: 237 10*3/uL (ref 150–400)
RBC: 4.16 MIL/uL (ref 3.87–5.11)
RDW: 12.4 % (ref 11.5–15.5)
WBC: 5.3 10*3/uL (ref 4.0–10.5)
nRBC: 0 % (ref 0.0–0.2)

## 2017-12-13 LAB — BASIC METABOLIC PANEL
Anion gap: 6 (ref 5–15)
BUN: 7 mg/dL (ref 6–20)
CO2: 25 mmol/L (ref 22–32)
Calcium: 8.9 mg/dL (ref 8.9–10.3)
Chloride: 107 mmol/L (ref 98–111)
Creatinine, Ser: 0.68 mg/dL (ref 0.44–1.00)
GFR calc Af Amer: >60 mL/min (ref >60)
GFR calc non Af Amer: >60 mL/min (ref >60)
Glucose, Bld: 92 mg/dL (ref 70–99)
Potassium: 3.8 mmol/L (ref 3.5–5.1)
Sodium: 138 mmol/L (ref 135–145)

## 2017-12-13 LAB — I-STAT BETA HCG BLOOD, ED (MC, WL, AP ONLY): I-stat hCG, quantitative: <5 m[IU]/mL (ref <5)

## 2017-12-13 MED ORDER — SODIUM CHLORIDE 0.9 % IV BOLUS
1000.0000 mL | Freq: Once | INTRAVENOUS | Status: AC
  Administered 2017-12-13: 1000 mL via INTRAVENOUS

## 2017-12-13 MED ORDER — MECLIZINE HCL 25 MG PO TABS
25.0000 mg | ORAL_TABLET | Freq: Once | ORAL | Status: AC
  Administered 2017-12-13: 25 mg via ORAL
  Filled 2017-12-13: qty 1

## 2017-12-13 MED ORDER — MECLIZINE HCL 25 MG PO TABS: 25.0000 mg | ORAL_TABLET | Freq: Three times a day (TID) | ORAL | 0 refills | Status: AC | PRN

## 2017-12-13 NOTE — ED Provider Notes (Signed)
[facility] Provider Note   CSN: [id number] Arrival date & time: 12/13/17  0177     History   Chief Complaint Chief Complaint  Patient presents with  . Dizziness    HPI Jessica Gallegos is a 37 y.o. female.  HPI   37 year old female with no pertinent PMH presents with chief complaint of dizziness.  Patient states that she woke this a.m. with room spinning dizziness.  Patient rolled over to Dr. husband and the spinning got worse.  Patient sat up and after approximately 10 seconds the spinning stopped.  Patient is able to predictively reproduce the room spinning sensation by turning her head or looking down with her head.  Patient denies symptoms when her head is not moving.  Denies lightheadedness, presyncopal or syncopal events.  Denies recent head trauma.  Denies recent infections.  Denies focal numbness tingling or weakness.  Patient denies history of similar symptoms in the past.  Past Medical History:  Diagnosis Date  . Acute blood loss anemia 12/04/2010  . History of chicken pox   . Maternal iron deficiency anemia complicating pregnancy 9/39/0300  . Migraine   . Postpartum care following vaginal delivery (9/29) 12/03/2010    Patient Active Problem List   Diagnosis Date Noted  . Diarrhea 12/19/2013  . Dermoid cyst of ovary 07/23/2011    Past Surgical History:  Procedure Laterality Date  . DILATION AND CURETTAGE OF UTERUS    . dilation and currettage x2    . LAPAROTOMY  07/21/2011   Procedure: EXPLORATORY LAPAROTOMY;  Surgeon: Marvene Staff, MD;  Location: [facility];  Service: Gynecology;  Laterality: N/A;  with tubal ligation  . OVARIAN CYST REMOVAL  07/21/2011   Procedure: OVARIAN CYSTECTOMY;  Surgeon: Marvene Staff, MD;  Location: [facility];  Service: Gynecology;  Laterality: Right;  Right ovarian cystectomy.  Marland Kitchen SALPINGOOPHORECTOMY  07/21/2011   Procedure: SALPINGO OOPHERECTOMY;  Surgeon: Marvene Staff, MD;  Location:  [facility];  Service: Gynecology;  Laterality: Left;     OB History    Gravida  4   Para  2   Term  2   Preterm      AB  2   Living  2     SAB      TAB  2   Ectopic      Multiple      Live Births  1            Home Medications    Prior to Admission medications   Medication Sig Start Date End Date Taking? Authorizing Provider  amoxicillin-clavulanate (AUGMENTIN) 875-125 MG per tablet Take 1 tablet by mouth 2 (two) times daily. One po bid x 7 days Patient not taking: Reported on 12/13/2017 04/15/14   Hess, Hessie Diener, PA-C  meclizine (ANTIVERT) 25 MG tablet Take 1 tablet (25 mg total) by mouth 3 (three) times daily as needed for dizziness. 12/13/17   Keenan Bachelor, MD    Family History No family history on file.  Social History Social History   Tobacco Use  . Smoking status: Never Smoker  . Smokeless tobacco: Never Used  Substance Use Topics  . Alcohol use: No  . Drug use: No     Allergies   Patient has no known allergies.   Review of Systems Review of Systems  Constitutional: Negative for chills and fever.  HENT: Negative for ear pain and sore throat.   Eyes: Negative for pain and visual disturbance.  Respiratory: Negative for cough and shortness of breath.   Cardiovascular: Negative for chest pain and palpitations.  Gastrointestinal: Negative for abdominal pain and vomiting.  Genitourinary: Negative for dysuria and hematuria.  Musculoskeletal: Negative for arthralgias and back pain.  Skin: Negative for color change and rash.  Neurological: Positive for dizziness. Negative for seizures and syncope.  All other systems reviewed and are negative.    Physical Exam Updated Vital Signs BP 103/75   Pulse 79   Temp 98.4 F (36.9 C) (Oral)   Resp 18   Ht 5\' 8"  (1.727 m)   Wt 50.8 kg   LMP 11/19/2017   SpO2 99%   BMI 17.03 kg/m   Physical Exam  Constitutional: She is oriented to person, place, and time. She appears well-developed and  well-nourished. No distress.  HENT:  Head: Normocephalic and atraumatic.  Eyes: Conjunctivae are normal.  Neck: Neck supple.  Cardiovascular: Normal rate and regular rhythm.  No murmur heard. Pulmonary/Chest: Effort normal and breath sounds normal. No respiratory distress.  Abdominal: Soft. There is no tenderness.  Musculoskeletal: She exhibits no edema.  Neurological: She is alert and oriented to person, place, and time. GCS eye subscore is 4. GCS verbal subscore is 5. GCS motor subscore is 6.  PERRLA, CN II through XII intact, 5-5 motor bilateral upper and lower extremities, normal sensation throughout, normal ambulation without ataxia.  Patient without dizziness at rest.  Patient with negative head impulse.  Patient with positive Dix-Hallpike maneuver to the left without nystagmus.  Skin: Skin is warm and dry. Capillary refill takes less than 2 seconds.  Psychiatric: She has a normal mood and affect.  Nursing note and vitals reviewed.    ED Treatments / Results  Labs (all labs ordered are listed, but only abnormal results are displayed) Labs Reviewed  CBC WITH DIFFERENTIAL/PLATELET  BASIC METABOLIC PANEL  I-STAT BETA HCG BLOOD, ED ([facility], [facility], [facility] ONLY)  I-STAT BETA HCG BLOOD, ED ([facility], [facility], [facility] ONLY)    EKG EKG Interpretation  Date/Time:  Thursday December 13 2017 05:42:26 EDT Ventricular Rate:  84 PR Interval:  138 QRS Duration: 82 QT Interval:  362 QTC Calculation: 427 R Axis:   104 Text Interpretation:  Normal sinus rhythm Rightward axis Septal infarct , age undetermined Abnormal ECG No significant change since last tracing Confirmed by Wandra Arthurs [phone]) on 12/13/2017 8:20:33 AM   Radiology No results found.  Procedures Procedures (including critical care time)  Medications Ordered in ED Medications  sodium chloride 0.9 % bolus 1,000 mL (0 mLs Intravenous Stopped 12/13/17 0944)  meclizine (ANTIVERT) tablet 25 mg (25 mg Oral Given 12/13/17 0914)     Initial  Impression / Assessment and Plan / ED Course  I have reviewed the triage vital signs and the nursing notes.  Pertinent labs & imaging results that were available during my care of the patient were reviewed by me and considered in my medical decision making (see chart for details).     37 year old female with no pertinent PMH presents with chief complaint of dizziness. Hx as above.  DDX includes central versus peripheral vertigo.  History and physical consistent with BPPV.  Labs imaging performed.  No hemoglobin.  Normal watch lites.  EKG with with normal intervals.  Normal sinus rhythm.  Patient with BPPV.  Epley maneuver works to the left.  Advised to my control with meclizine and home trial of Epley maneuver, patient given instructions.  She is stable for discharge.  Patient seen adjudged  my attending Dr. Darl Householder agrees with plan and disposition.  Final Clinical Impressions(s) / ED Diagnoses   Final diagnoses:  BPPV (benign paroxysmal positional vertigo), left    ED Discharge Orders         Ordered    meclizine (ANTIVERT) 25 MG tablet  3 times daily PRN     12/13/17 1012           Keenan Bachelor, MD 12/13/17 1013    Drenda Freeze, MD 12/15/17 [phone]

## 2017-12-13 NOTE — ED Notes (Signed)
D/c reviewed with patient and spouse 

## 2017-12-13 NOTE — ED Triage Notes (Signed)
The pt woke up 0400am with dizziness no nausea  No pain anywhere

## 2018-02-06 ENCOUNTER — Emergency Department (HOSPITAL_COMMUNITY): Payer: 59

## 2018-02-06 ENCOUNTER — Emergency Department (HOSPITAL_COMMUNITY)
Admission: EM | Admit: 2018-02-06 | Discharge: 2018-02-06 | Disposition: A | Discharge status: Home / Self Care | Payer: 59 | Attending: Emergency Medicine | Admitting: Emergency Medicine

## 2018-02-06 ENCOUNTER — Encounter (HOSPITAL_COMMUNITY): Payer: Self-pay | Admitting: Emergency Medicine

## 2018-02-06 DIAGNOSIS — I493 Ventricular premature depolarization: Secondary | ICD-10-CM | POA: Insufficient documentation

## 2018-02-06 DIAGNOSIS — R009 Unspecified abnormalities of heart beat: Secondary | ICD-10-CM | POA: Diagnosis present

## 2018-02-06 LAB — BASIC METABOLIC PANEL
Anion gap: 9 (ref 5–15)
BUN: 7 mg/dL (ref 6–20)
CALCIUM: 8.5 mg/dL — AB (ref 8.9–10.3)
CO2: 23 mmol/L (ref 22–32)
Chloride: 105 mmol/L (ref 98–111)
Creatinine, Ser: 0.63 mg/dL (ref 0.44–1.00)
GLUCOSE: 99 mg/dL (ref 70–99)
POTASSIUM: 3.7 mmol/L (ref 3.5–5.1)
Sodium: 137 mmol/L (ref 135–145)

## 2018-02-06 LAB — CBC
HCT: 37.8 % (ref 36.0–46.0)
Hemoglobin: 12.1 g/dL (ref 12.0–15.0)
MCH: 30.7 pg (ref 26.0–34.0)
MCHC: 32 g/dL (ref 30.0–36.0)
MCV: 95.9 fL (ref 80.0–100.0)
PLATELETS: 210 10*3/uL (ref 150–400)
RBC: 3.94 MIL/uL (ref 3.87–5.11)
RDW: 12.6 % (ref 11.5–15.5)
WBC: 3.8 10*3/uL — AB (ref 4.0–10.5)
nRBC: 0 % (ref 0.0–0.2)

## 2018-02-06 LAB — I-STAT BETA HCG BLOOD, ED (MC, WL, AP ONLY): I-stat hCG, quantitative: <5 m[IU]/mL (ref <5)

## 2018-02-06 LAB — I-STAT TROPONIN, ED: TROPONIN I, POC: 0 ng/mL (ref 0.00–0.08)

## 2018-02-06 NOTE — ED Triage Notes (Signed)
Pt reports moments "it feels like my heart is going to pound out of my chest."  She is usually at work or lying down when it starts.  No other symptoms occur during this episode.

## 2018-02-06 NOTE — Discharge Instructions (Signed)
As we discussed, you have some PVC's (the early beat).  See information on back to read about this a little more. Follow-up with your primary care doctor as scheduled. Return to the ED for new or worsening symptoms-- chest pain, SOB, dizziness, weakness, sweating, feeling like you are going to pass out, etc.

## 2018-02-06 NOTE — ED Provider Notes (Signed)
[facility] EMERGENCY DEPARTMENT Provider Note   CSN: [id number] Arrival date & time: 02/06/18  0303     History   Chief Complaint Chief Complaint  Patient presents with  . Tachycardia    HPI Jessica Gallegos is a 37 y.o. female.  The history is provided by the patient and medical records.     37 year old female with history of anemia, migraine headaches, presenting to the ED with palpitations.  Reports she has been feeling this over the past week.  States she notices it more so when she is in bed trying to sleep, usually if she lays on her left side or on her stomach.  States she feels like it is an intense, loud heartbeat but not necessarily fast.  She is not had any chest pain or shortness of breath with this.  She has no dizziness or lightheadedness.  No feelings of syncope.  She has no known cardiac history.  No family cardiac history or history of arrhythmia.  She is not a smoker.  Denies any excessive caffeine intake.  No thyroid disease.  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  Past Medical History:  Diagnosis Date  . Acute blood loss anemia 12/04/2010  . History of chicken pox   . Maternal iron deficiency anemia complicating pregnancy 03/24/1476  . Migraine   . Postpartum care following vaginal delivery (9/29) 12/03/2010    Patient Active Problem List   Diagnosis Date Noted  . Diarrhea 12/19/2013  . Dermoid cyst of ovary 07/23/2011    Past Surgical History:  Procedure Laterality Date  . DILATION AND CURETTAGE OF UTERUS    . dilation and currettage x2    . LAPAROTOMY  07/21/2011   Procedure: EXPLORATORY LAPAROTOMY;  Surgeon: Marvene Staff, MD;  Location: [facility];  Service: Gynecology;  Laterality: N/A;  with tubal ligation  . OVARIAN CYST REMOVAL  07/21/2011   Procedure: OVARIAN CYSTECTOMY;  Surgeon: Marvene Staff, MD;  Location: [facility];  Service: Gynecology;  Laterality: Right;  Right  ovarian cystectomy.  Marland Kitchen SALPINGOOPHORECTOMY  07/21/2011   Procedure: SALPINGO OOPHERECTOMY;  Surgeon: Marvene Staff, MD;  Location: [facility];  Service: Gynecology;  Laterality: Left;     OB History    Gravida  4   Para  2   Term  2   Preterm      AB  2   Living  2     SAB      TAB  2   Ectopic      Multiple      Live Births  1            Home Medications    Prior to Admission medications   Medication Sig Start Date End Date Taking? Authorizing Provider  amoxicillin-clavulanate (AUGMENTIN) 875-125 MG per tablet Take 1 tablet by mouth 2 (two) times daily. One po bid x 7 days Patient not taking: Reported on 12/13/2017 04/15/14   Hess, Hessie Diener, PA-C  meclizine (ANTIVERT) 25 MG tablet Take 1 tablet (25 mg total) by mouth 3 (three) times daily as needed for dizziness. 12/13/17   Keenan Bachelor, MD    Family History No family history on file.  Social History Social History   Tobacco Use  . Smoking status: Never Smoker  . Smokeless tobacco: Never Used  Substance Use Topics  . Alcohol use: No  . Drug use: No  Allergies   Patient has no known allergies.   Review of Systems Review of Systems  Cardiovascular: Positive for palpitations.  All other systems reviewed and are negative.    Physical Exam Updated Vital Signs BP 111/88 (BP Location: Right Arm)   Pulse 77   Temp 98.8 F (37.1 C) (Oral)   Resp 18   SpO2 100%   Physical Exam  Constitutional: She is oriented to person, place, and time. She appears well-developed and well-nourished.  HENT:  Head: Normocephalic and atraumatic.  Mouth/Throat: Oropharynx is clear and moist.  Eyes: Pupils are equal, round, and reactive to light. Conjunctivae and EOM are normal.  Neck: Normal range of motion.  Cardiovascular: Normal rate, regular rhythm and normal heart sounds.  HR in the 80's throughout entire exam, RRR  Pulmonary/Chest: Effort normal and breath sounds normal. No stridor. No respiratory  distress.  Abdominal: Soft. Bowel sounds are normal.  Musculoskeletal: Normal range of motion.  Neurological: She is alert and oriented to person, place, and time.  Skin: Skin is warm and dry.  Psychiatric: She has a normal mood and affect.  Nursing note and vitals reviewed.    ED Treatments / Results  Labs (all labs ordered are listed, but only abnormal results are displayed) Labs Reviewed  BASIC METABOLIC PANEL - Abnormal; Notable for the following components:      Result Value   Calcium 8.5 (*)    All other components within normal limits  CBC - Abnormal; Notable for the following components:   WBC 3.8 (*)    All other components within normal limits  I-STAT TROPONIN, ED  I-STAT BETA HCG BLOOD, ED ([facility], [facility], [facility] ONLY)    EKG EKG Interpretation  Date/Time:  Wednesday February 06 2018 03:10:48 EST Ventricular Rate:  84 PR Interval:  126 QRS Duration: 84 QT Interval:  394 QTC Calculation: 465 R Axis:   100 Text Interpretation:  Sinus rhythm with occasional Premature ventricular complexes and Premature atrial complexes Rightward axis Low voltage QRS Abnormal QRS-T angle, consider primary T wave abnormality Abnormal ECG Now PVCS/PACS present Confirmed by Thayer Jew [phone]) on 02/06/2018 3:16:13 AM   Radiology Dg Chest 2 View  Result Date: 02/06/2018 CLINICAL DATA:  37 year old female with tachycardia. EXAM: CHEST - 2 VIEW COMPARISON:  None. FINDINGS: The heart size and mediastinal contours are within normal limits. Both lungs are clear. The visualized skeletal structures are unremarkable. IMPRESSION: No active cardiopulmonary disease. Electronically Signed   By: Anner Crete M.D.   On: 02/06/2018 03:29    Procedures Procedures (including critical care time)  Medications Ordered in ED Medications - No data to display   Initial Impression / Assessment and Plan / ED Course  I have reviewed the triage vital signs and the nursing notes.  Pertinent labs & imaging  results that were available during my care of the patient were reviewed by me and considered in my medical decision making (see chart for details).  37 year old female here with palpitations, worse at night when lying on left side or on her stomach.  She has not had any associated chest pain or shortness of breath.  No exertional symptoms.  She is afebrile and nontoxic.  Heart rate stable in the 80s throughout entire exam, she does have some PVCs noted on the cardiac monitor as well as her EKG.  Remainder of cardiac work-up including labs, troponin, and chest x-ray are all within normal limits.  She is very active and does not have any risk  factors for DVT or PE.  Symptoms atypical or ACS.  I suspect what she has been feeling the PVCs.  We have discussed this in great detail and patient feels reassured.  She has scheduled follow-up with PCP in a few weeks.  She will return here for any new/acute changes.  Final Clinical Impressions(s) / ED Diagnoses   Final diagnoses:  PVC's (premature ventricular contractions)    ED Discharge Orders    None       Larene Pickett, PA-C 02/06/18 0522    Orpah Greek, MD 02/06/18 [phone]

## 2018-03-07 ENCOUNTER — Other Ambulatory Visit: Payer: Self-pay

## 2018-03-07 ENCOUNTER — Emergency Department
Admission: EM | Admit: 2018-03-07 | Discharge: 2018-03-07 | Discharge status: Against Medical Advice | Payer: 59 | Attending: Emergency Medicine | Admitting: Emergency Medicine

## 2018-03-07 ENCOUNTER — Encounter: Payer: Self-pay | Admitting: Emergency Medicine

## 2018-03-07 DIAGNOSIS — R002 Palpitations: Secondary | ICD-10-CM | POA: Insufficient documentation

## 2018-03-07 DIAGNOSIS — Z5321 Procedure and treatment not carried out due to patient leaving prior to being seen by health care provider: Secondary | ICD-10-CM | POA: Insufficient documentation

## 2018-03-07 LAB — COMPREHENSIVE METABOLIC PANEL
ALK PHOS: 55 U/L (ref 38–126)
ALT: 18 U/L (ref 0–44)
AST: 21 U/L (ref 15–41)
Albumin: 4.1 g/dL (ref 3.5–5.0)
Anion gap: 4 — ABNORMAL LOW (ref 5–15)
BUN: 11 mg/dL (ref 6–20)
CO2: 24 mmol/L (ref 22–32)
CREATININE: 0.59 mg/dL (ref 0.44–1.00)
Calcium: 8.6 mg/dL — ABNORMAL LOW (ref 8.9–10.3)
Chloride: 108 mmol/L (ref 98–111)
GFR calc Af Amer: >60 mL/min (ref >60)
Glucose, Bld: 103 mg/dL — ABNORMAL HIGH (ref 70–99)
Potassium: 3.7 mmol/L (ref 3.5–5.1)
Sodium: 136 mmol/L (ref 135–145)
Total Bilirubin: 0.9 mg/dL (ref 0.3–1.2)
Total Protein: 7.4 g/dL (ref 6.5–8.1)

## 2018-03-07 LAB — CBC WITH DIFFERENTIAL/PLATELET
ABS IMMATURE GRANULOCYTES: 0.01 10*3/uL (ref 0.00–0.07)
Basophils Absolute: 0 10*3/uL (ref 0.0–0.1)
Basophils Relative: 1 %
EOS ABS: 0 10*3/uL (ref 0.0–0.5)
Eosinophils Relative: 0 %
HEMATOCRIT: 37.3 % (ref 36.0–46.0)
Hemoglobin: 12.3 g/dL (ref 12.0–15.0)
IMMATURE GRANULOCYTES: 0 %
Lymphocytes Relative: 30 %
Lymphs Abs: 1.6 10*3/uL (ref 0.7–4.0)
MCH: 31.2 pg (ref 26.0–34.0)
MCHC: 33 g/dL (ref 30.0–36.0)
MCV: 94.7 fL (ref 80.0–100.0)
MONOS PCT: 9 %
Monocytes Absolute: 0.5 10*3/uL (ref 0.1–1.0)
NEUTROS PCT: 60 %
Neutro Abs: 3.2 10*3/uL (ref 1.7–7.7)
Platelets: 221 10*3/uL (ref 150–400)
RBC: 3.94 MIL/uL (ref 3.87–5.11)
RDW: 12.8 % (ref 11.5–15.5)
WBC: 5.3 10*3/uL (ref 4.0–10.5)
nRBC: 0 % (ref 0.0–0.2)

## 2018-03-07 LAB — TROPONIN I: Troponin I: <0.03 ng/mL (ref <0.03)

## 2018-03-07 NOTE — ED Triage Notes (Signed)
Patient ambulatory to triage with steady gait, without difficulty or distress noted; pt reports palpitations "all day"; denies any accomp symptoms; st hx of same and dx with palpitations

## 2018-03-08 ENCOUNTER — Telehealth: Payer: Self-pay | Admitting: Emergency Medicine

## 2018-03-08 NOTE — Telephone Encounter (Signed)
Called patient due to lwot to inquire about condition and follow up plans. Says she feels okay.  Has pcp, but cant see til the 13th.  I told her to return any time.

## 2018-03-09 ENCOUNTER — Other Ambulatory Visit: Payer: Self-pay

## 2018-03-09 ENCOUNTER — Emergency Department
Admission: EM | Admit: 2018-03-09 | Discharge: 2018-03-09 | Disposition: A | Discharge status: Home / Self Care | Payer: 59 | Attending: Emergency Medicine | Admitting: Emergency Medicine

## 2018-03-09 ENCOUNTER — Encounter: Payer: Self-pay | Admitting: Emergency Medicine

## 2018-03-09 DIAGNOSIS — I493 Ventricular premature depolarization: Secondary | ICD-10-CM

## 2018-03-09 LAB — BASIC METABOLIC PANEL
Anion gap: 5 (ref 5–15)
BUN: 9 mg/dL (ref 6–20)
CO2: 25 mmol/L (ref 22–32)
Calcium: 8.6 mg/dL — ABNORMAL LOW (ref 8.9–10.3)
Chloride: 107 mmol/L (ref 98–111)
Creatinine, Ser: 0.66 mg/dL (ref 0.44–1.00)
GFR calc non Af Amer: >60 mL/min (ref >60)
Glucose, Bld: 98 mg/dL (ref 70–99)
Potassium: 3.7 mmol/L (ref 3.5–5.1)
Sodium: 137 mmol/L (ref 135–145)

## 2018-03-09 LAB — CBC
HCT: 37 % (ref 36.0–46.0)
Hemoglobin: 12.3 g/dL (ref 12.0–15.0)
MCH: 31.5 pg (ref 26.0–34.0)
MCHC: 33.2 g/dL (ref 30.0–36.0)
MCV: 94.6 fL (ref 80.0–100.0)
Platelets: 215 10*3/uL (ref 150–400)
RBC: 3.91 MIL/uL (ref 3.87–5.11)
RDW: 12.8 % (ref 11.5–15.5)
WBC: 3.6 10*3/uL — ABNORMAL LOW (ref 4.0–10.5)
nRBC: 0 % (ref 0.0–0.2)

## 2018-03-09 LAB — TROPONIN I: Troponin I: <0.03 ng/mL (ref <0.03)

## 2018-03-09 LAB — MAGNESIUM: Magnesium: 2 mg/dL (ref 1.7–2.4)

## 2018-03-09 LAB — TSH: TSH: 0.919 u[IU]/mL (ref 0.350–4.500)

## 2018-03-09 MED ORDER — PROPRANOLOL HCL 10 MG PO TABS: 10.0000 mg | ORAL_TABLET | Freq: Two times a day (BID) | ORAL | 0 refills | Status: AC

## 2018-03-09 NOTE — ED Triage Notes (Signed)
Patient states that she feels like that she is having palpitations. Patient states that it started a couple of days ago. Patient states that she came here a couple of days ago for the same and had EKG and blood work but not seen the MD. Patient states that the palpitations are worse now. Patient denies chest pain or shortness of breath.

## 2018-03-09 NOTE — Discharge Instructions (Signed)
Follow-up with your primary care doctor on the 13th as planned.  We have provided a referral to the [facility] cardiology outpatient office.  You should call to make an appointment as soon as possible within the next few weeks.  Your palpitations are likely caused by extra beats called PVCs.  These are not dangerous.  We have prescribed propranolol to help with the symptoms until you are able to follow-up, however if you are not having the symptoms you do not need to take it..  You may need some additional testing done by the cardiologist to further evaluate this symptom.  Return to the ER for new, worsening, or persistent palpitations, difficulty breathing, weakness or lightheadedness, or any other new or worsening symptoms that concern you.

## 2018-03-09 NOTE — ED Provider Notes (Signed)
[facility] Emergency Department Provider Note ____________________________________________   First MD Initiated Contact with Patient 03/09/18 [phone]     (approximate)  I have reviewed the triage vital signs and the nursing notes.   HISTORY  Chief Complaint Palpitations    HPI Jessica Gallegos is a 38 y.o. female with PMH as noted below who presents with palpitations over approximately last month, intermittent course, worse at night, and nonexertional.  She states it feels like her heart is pounding and like she has skipped or extra beats.  She denies associated lightheadedness, shortness of breath, nausea, or chest pain.  The patient was seen at [facility] last month and diagnosed with PVCs.  She came to the ED here 2 days ago and had an EKG and labs but left prior to being seen by the physician.  Past Medical History:  Diagnosis Date  . Acute blood loss anemia 12/04/2010  . History of chicken pox   . Maternal iron deficiency anemia complicating pregnancy 1/91/4782  . Migraine   . Postpartum care following vaginal delivery (9/29) 12/03/2010    Patient Active Problem List   Diagnosis Date Noted  . Diarrhea 12/19/2013  . Dermoid cyst of ovary 07/23/2011    Past Surgical History:  Procedure Laterality Date  . DILATION AND CURETTAGE OF UTERUS    . dilation and currettage x2    . LAPAROTOMY  07/21/2011   Procedure: EXPLORATORY LAPAROTOMY;  Surgeon: Marvene Staff, MD;  Location: [facility];  Service: Gynecology;  Laterality: N/A;  with tubal ligation  . OVARIAN CYST REMOVAL  07/21/2011   Procedure: OVARIAN CYSTECTOMY;  Surgeon: Marvene Staff, MD;  Location: [facility];  Service: Gynecology;  Laterality: Right;  Right ovarian cystectomy.  Marland Kitchen SALPINGOOPHORECTOMY  07/21/2011   Procedure: SALPINGO OOPHERECTOMY;  Surgeon: Marvene Staff, MD;  Location: [facility];  Service: Gynecology;  Laterality: Left;    Prior to Admission medications   Medication Sig  Start Date End Date Taking? Authorizing Provider  amoxicillin-clavulanate (AUGMENTIN) 875-125 MG per tablet Take 1 tablet by mouth 2 (two) times daily. One po bid x 7 days Patient not taking: Reported on 12/13/2017 04/15/14   Hess, Hessie Diener, PA-C  meclizine (ANTIVERT) 25 MG tablet Take 1 tablet (25 mg total) by mouth 3 (three) times daily as needed for dizziness. 12/13/17   Keenan Bachelor, MD  propranolol (INDERAL) 10 MG tablet Take 1 tablet (10 mg total) by mouth 2 (two) times daily for 15 days. 03/09/18 03/24/18  Arta Silence, MD    Allergies Patient has no known allergies.  No family history on file.  Social History Social History   Tobacco Use  . Smoking status: Never Smoker  . Smokeless tobacco: Never Used  Substance Use Topics  . Alcohol use: No  . Drug use: No    Review of Systems  Constitutional: No fever. Eyes: No redness. ENT: No sore throat. Cardiovascular: Denies chest pain. Respiratory: Denies shortness of breath. Gastrointestinal: No nausea.  Genitourinary: Negative for flank pain.  Musculoskeletal: Negative for back pain. Skin: Negative for rash. Neurological: Negative for headache.   ____________________________________________   PHYSICAL EXAM:  VITAL SIGNS: ED Triage Vitals  Enc Vitals Group     BP 03/09/18 0647 106/71     Pulse Rate 03/09/18 0647 88     Resp 03/09/18 0647 18     Temp 03/09/18 0647 98.1 F (36.7 C)     Temp Source 03/09/18 0647 Oral  SpO2 03/09/18 0647 99 %     Weight 03/09/18 0645 112 lb (50.8 kg)     Height 03/09/18 0645 5\' 8"  (1.727 m)     Head Circumference --      Peak Flow --      Pain Score 03/09/18 0645 0     Pain Loc --      Pain Edu? --      Excl. in [place]? --     Constitutional: Alert and oriented. Well appearing and in no acute distress. Eyes: Conjunctivae are normal.  Head: Atraumatic. Nose: No congestion/rhinnorhea. Mouth/Throat: Mucous membranes are moist.   Neck: Normal range of motion.    Cardiovascular: Normal rate, mostly regular rhythm. Grossly normal heart sounds.  Good peripheral circulation. Respiratory: Normal respiratory effort.  No retractions. Lungs CTAB. Gastrointestinal: No distention.  Musculoskeletal: Extremities warm and well perfused.  Neurologic:  Normal speech and language. No gross focal neurologic deficits are appreciated.  Skin:  Skin is warm and dry. No rash noted. Psychiatric: Mood and affect are normal. Speech and behavior are normal.  ____________________________________________   LABS (all labs ordered are listed, but only abnormal results are displayed)  Labs Reviewed  BASIC METABOLIC PANEL - Abnormal; Notable for the following components:      Result Value   Calcium 8.6 (*)    All other components within normal limits  CBC - Abnormal; Notable for the following components:   WBC 3.6 (*)    All other components within normal limits  TROPONIN I  MAGNESIUM  TSH  POC URINE PREG, ED   ____________________________________________  EKG  ED ECG REPORT I, Arta Silence, the attending physician, personally viewed and interpreted this ECG.  Date: 03/09/2018 EKG Time: 721 Rate: 63 Rhythm: normal sinus rhythm with frequent PVCs QRS Axis: normal Intervals: normal ST/T Wave abnormalities: normal Narrative Interpretation: no evidence of acute ischemia  ____________________________________________  RADIOLOGY    ____________________________________________   PROCEDURES  Procedure(s) performed: No  Procedures  Critical Care performed: No ____________________________________________   INITIAL IMPRESSION / ASSESSMENT AND PLAN / ED COURSE  Pertinent labs & imaging results that were available during my care of the patient were reviewed by me and considered in my medical decision making (see chart for details).  38 year old female with no significant PMH presents with palpitations occurring intermittently over the last month  and somewhat worsened in the last few days.  The patient was evaluated at [facility] on 02/06/2018 for this.  I reviewed these records in [place].  The patient was noted to have PVCs but her work-up was otherwise negative and she was sent home.  She also came to the ED here 2 days ago and had labs and an EKG done.  EKG continued to show PVCs and the lab work-up was normal.  The patient left prior to being seen by a physician due to the long wait.  On exam today she is slightly anxious but otherwise comfortable appearing.  Her vital signs are normal except for borderline low blood pressure which the patient states is normal for her.  The remainder of her exam is unremarkable.  EKG here shows sinus rhythm with frequent PVCs.  Overall I suspect that the PVCs are the source of the patient's symptoms.  There is no evidence of other arrhythmia.  The symptoms are intermittent and gradual.  Although the patient's work-up so far has been negative, we will repeat labs today, and I will add on a magnesium and TSH to rule  out other precipitating causes.  Since the patient has now had the symptoms for a month, has relatively frequent PVCs, and is clearly symptomatic, I will discharge her on a beta-blocker and refer her to cardiology.  ----------------------------------------- 8:40 AM on 03/09/2018 -----------------------------------------  TSH and magnesium are normal.  The patient continues to appear comfortable.  She is stable for discharge home at this time.  I counseled her extensively on the results of the work-up, the likely cause of her symptoms, what PVCs are, and on the follow-up plan.  She agrees to follow-up with her primary care doctor and we have provided a referral to cardiology.  In addition, because the patient is symptomatic I offered to start her on a beta-blocker.  The patient would like to take as little medication as possible so I will put her on the lowest possible dose and I instructed her that  she does not need to take it if she is not having continued symptoms.  Return precautions given, and the patient expresses understanding. ____________________________________________   FINAL CLINICAL IMPRESSION(S) / ED DIAGNOSES  Final diagnoses:  Premature ventricular contractions (PVCs) (VPCs)      NEW MEDICATIONS STARTED DURING THIS VISIT:  New Prescriptions   PROPRANOLOL (INDERAL) 10 MG TABLET    Take 1 tablet (10 mg total) by mouth 2 (two) times daily for 15 days.     Note:  This document was prepared using Dragon voice recognition software and may include unintentional dictation errors.    Arta Silence, MD 03/09/18 [phone]

## 2018-03-09 NOTE — ED Notes (Signed)
ED Provider at bedside. 

## 2018-03-09 NOTE — ED Notes (Signed)
Pt requested doctors note. Rn obtained permission and note was provided.

## 2018-03-09 NOTE — ED Notes (Signed)
Pt presents today for palpitations that started a few weeks ago. Pt was seen at [facility] on 12/4 for the same s/s. Pt was seen here a few days ago but left w/o being seen by EDP. Pt denies N/V/D chest pain or shob. Pt is a&o x4.

## 2018-12-31 ENCOUNTER — Other Ambulatory Visit: Payer: Self-pay

## 2018-12-31 ENCOUNTER — Emergency Department: Payer: 59

## 2018-12-31 ENCOUNTER — Encounter: Payer: Self-pay | Admitting: Emergency Medicine

## 2018-12-31 ENCOUNTER — Emergency Department
Admission: EM | Admit: 2018-12-31 | Discharge: 2018-12-31 | Disposition: A | Discharge status: Home / Self Care | Payer: 59 | Attending: Emergency Medicine | Admitting: Emergency Medicine

## 2018-12-31 DIAGNOSIS — R0789 Other chest pain: Secondary | ICD-10-CM | POA: Diagnosis present

## 2018-12-31 LAB — BASIC METABOLIC PANEL
Anion gap: 9 (ref 5–15)
BUN: 12 mg/dL (ref 6–20)
CO2: 24 mmol/L (ref 22–32)
Calcium: 9.2 mg/dL (ref 8.9–10.3)
Chloride: 106 mmol/L (ref 98–111)
Creatinine, Ser: 0.65 mg/dL (ref 0.44–1.00)
GFR calc Af Amer: >60 mL/min (ref >60)
GFR calc non Af Amer: >60 mL/min (ref >60)
Glucose, Bld: 97 mg/dL (ref 70–99)
Potassium: 3.7 mmol/L (ref 3.5–5.1)
Sodium: 139 mmol/L (ref 135–145)

## 2018-12-31 LAB — CBC
HCT: 40.3 % (ref 36.0–46.0)
Hemoglobin: 13.3 g/dL (ref 12.0–15.0)
MCH: 30.9 pg (ref 26.0–34.0)
MCHC: 33 g/dL (ref 30.0–36.0)
MCV: 93.7 fL (ref 80.0–100.0)
Platelets: 269 10*3/uL (ref 150–400)
RBC: 4.3 MIL/uL (ref 3.87–5.11)
RDW: 12.4 % (ref 11.5–15.5)
WBC: 6.1 10*3/uL (ref 4.0–10.5)
nRBC: 0 % (ref 0.0–0.2)

## 2018-12-31 LAB — URINALYSIS, COMPLETE (UACMP) WITH MICROSCOPIC
Bacteria, UA: NONE SEEN
Bilirubin Urine: NEGATIVE
Glucose, UA: NEGATIVE mg/dL
Hgb urine dipstick: NEGATIVE
Ketones, ur: NEGATIVE mg/dL
Leukocytes,Ua: NEGATIVE
Nitrite: NEGATIVE
Protein, ur: NEGATIVE mg/dL
Specific Gravity, Urine: 1.004 — ABNORMAL LOW (ref 1.005–1.030)
pH: 7 (ref 5.0–8.0)

## 2018-12-31 LAB — TROPONIN I (HIGH SENSITIVITY)
Troponin I (High Sensitivity): <2 ng/L (ref <18)
Troponin I (High Sensitivity): <2 ng/L (ref <18)

## 2018-12-31 LAB — POCT PREGNANCY, URINE: Preg Test, Ur: NEGATIVE

## 2018-12-31 NOTE — Discharge Instructions (Signed)
Follow-up with your regular OB/GYN.  You can try to take an acid blocking medication at night, such as Pepcid (famotidine) which is available over-the-counter at the pharmacy.  You should also avoid eating at least several hours before you go to sleep.  Return to the ER for new, worsening, or persistent severe chest discomfort or pain, shortness of breath, weakness or lightheadedness, or any other new or worsening symptoms that concern you.

## 2018-12-31 NOTE — ED Provider Notes (Signed)
[facility] Emergency Department Provider Note ____________________________________________   First MD Initiated Contact with Patient 12/31/18 [phone]     (approximate)  I have reviewed the triage vital signs and the nursing notes.   HISTORY  Chief Complaint Chest Pain    HPI Jessica Gallegos is a 38 y.o. female with PMH as noted below who presents with chest discomfort (which she specifically states is not pain) over the last 4 to 5 days, intermittent, mainly occurring at night, and described as a shooting sensation that goes from her chest and down towards her upper abdomen.  She states sometimes it lasts for about 15 to 20 minutes, but sometimes will last for a few hours.  She denies associated shortness of breath, nausea, weakness or lightheadedness, or any leg pain or swelling.  She received a Depo hormone shot 8 days ago.  She has been receiving this for a few months without adverse effects.  Past Medical History:  Diagnosis Date  . Acute blood loss anemia 12/04/2010  . History of chicken pox   . Maternal iron deficiency anemia complicating pregnancy [id number]  . Migraine   . Postpartum care following vaginal delivery (9/29) 12/03/2010    Patient Active Problem List   Diagnosis Date Noted  . Diarrhea 12/19/2013  . Dermoid cyst of ovary 07/23/2011    Past Surgical History:  Procedure Laterality Date  . DILATION AND CURETTAGE OF UTERUS    . dilation and currettage x2    . LAPAROTOMY  07/21/2011   Procedure: EXPLORATORY LAPAROTOMY;  Surgeon: Marvene Staff, MD;  Location: [facility];  Service: Gynecology;  Laterality: N/A;  with tubal ligation  . OVARIAN CYST REMOVAL  07/21/2011   Procedure: OVARIAN CYSTECTOMY;  Surgeon: Marvene Staff, MD;  Location: [place] [facility];  Service: Gynecology;  Laterality: Right;  Right ovarian cystectomy.  Marland Kitchen SALPINGOOPHORECTOMY  07/21/2011   Procedure: SALPINGO OOPHERECTOMY;  Surgeon: Marvene Staff, MD;  Location: [place]  [facility];  Service: Gynecology;  Laterality: Left;    Prior to Admission medications   Medication Sig Start Date End Date Taking? Authorizing Provider  amoxicillin-clavulanate (AUGMENTIN) 875-125 MG per tablet Take 1 tablet by mouth 2 (two) times daily. One po bid x 7 days Patient not taking: Reported on 12/13/2017 04/15/14   Hess, Hessie Diener, PA-C  meclizine (ANTIVERT) 25 MG tablet Take 1 tablet (25 mg total) by mouth 3 (three) times daily as needed for dizziness. 12/13/17   Keenan Bachelor, MD  propranolol (INDERAL) 10 MG tablet Take 1 tablet (10 mg total) by mouth 2 (two) times daily for 15 days. 03/09/18 03/24/18  Arta Silence, MD    Allergies Patient has no known allergies.  History reviewed. No pertinent family history.  Social History Social History   Tobacco Use  . Smoking status: Never Smoker  . Smokeless tobacco: Never Used  Substance Use Topics  . Alcohol use: No  . Drug use: No    Review of Systems  Constitutional: No fever. Eyes: No redness. ENT: No sore throat. Cardiovascular: Positive for chest discomfort. Respiratory: Denies shortness of breath. Gastrointestinal: No vomiting or diarrhea.  Genitourinary: Negative for flank pain.  Musculoskeletal: Negative for back pain. Skin: Negative for rash. Neurological: Negative for headache.   ____________________________________________   PHYSICAL EXAM:  VITAL SIGNS: ED Triage Vitals  Enc Vitals Group     BP 12/31/18 0018 109/87     Pulse Rate 12/31/18 0018 76     Resp 12/31/18 0018 17  Temp 12/31/18 0018 98.1 F (36.7 C)     Temp Source 12/31/18 0018 Oral     SpO2 12/31/18 0018 99 %     Weight 12/31/18 0020 134 lb (60.8 kg)     Height 12/31/18 0020 5\' 8"  (1.727 m)     Head Circumference --      Peak Flow --      Pain Score --      Pain Loc --      Pain Edu? --      Excl. in [place]? --     Constitutional: Alert and oriented. Well appearing and in no acute distress. Eyes: Conjunctivae are normal.   Head: Atraumatic. Nose: No congestion/rhinnorhea. Mouth/Throat: Mucous membranes are moist.   Neck: Normal range of motion.  Cardiovascular: Normal rate, regular rhythm. Grossly normal heart sounds.  Good peripheral circulation. Respiratory: Normal respiratory effort.  No retractions. Lungs CTAB. Gastrointestinal: Soft and nontender. No distention.  Genitourinary: No flank tenderness. Musculoskeletal: No lower extremity edema.  No calf or popliteal swelling or tenderness.  Extremities warm and well perfused.  Neurologic:  Normal speech and language. No gross focal neurologic deficits are appreciated.  Skin:  Skin is warm and dry. No rash noted. Psychiatric: Mood and affect are normal. Speech and behavior are normal.  ____________________________________________   LABS (all labs ordered are listed, but only abnormal results are displayed)  Labs Reviewed  URINALYSIS, COMPLETE (UACMP) WITH MICROSCOPIC - Abnormal; Notable for the following components:      Result Value   Color, Urine STRAW (*)    APPearance CLEAR (*)    Specific Gravity, Urine 1.004 (*)    All other components within normal limits  BASIC METABOLIC PANEL  CBC  POC URINE PREG, ED  POCT PREGNANCY, URINE  TROPONIN I (HIGH SENSITIVITY)  TROPONIN I (HIGH SENSITIVITY)   ____________________________________________  EKG  ED ECG REPORT I, Arta Silence, the attending physician, personally viewed and interpreted this ECG.  Date: 12/31/2018 EKG Time: 1225 Rate: 82 Rhythm: normal sinus rhythm QRS Axis: normal Intervals: normal ST/T Wave abnormalities: Nonspecific T wave inversions Narrative Interpretation: no evidence of acute ischemia; no significant change when compared to EKG of 03/09/2018  ____________________________________________  RADIOLOGY  CXR: No focal infiltrate or other acute abnormality  ____________________________________________   PROCEDURES  Procedure(s) performed: No  Procedures   Critical Care performed: No ____________________________________________   INITIAL IMPRESSION / ASSESSMENT AND PLAN / ED COURSE  Pertinent labs & imaging results that were available during my care of the patient were reviewed by me and considered in my medical decision making (see chart for details).  38 year old female with PMH as noted above presents with atypical chest discomfort over the last several days, mainly occurring at night and when she is lying flat.  It is intermittent, not associated with shortness of breath, and nonexertional.  It started several days after she received a Depo contraceptive shot.  On exam the patient is very well-appearing.  Her vital signs are normal.  The physical exam is unremarkable.  The EKG is nonischemic.  Overall presentation is consistent with benign etiology such as most likely acid reflux, versus possible muscular chest pain/cramping, which could be a side effect of the shot.  The patient has no signs or symptoms to suggest PE or DVT, and PE would be exceptionally unlikely given the brief and intermittent nature of the symptoms.  There is no evidence of cardiac etiology.  Lab work-up including troponin is negative.  Given the duration  of the symptoms, there is no indication for repeat troponin.  The chest x-ray is also within normal limits.  I counseled the patient on the results of the work-up.  At this time, she is stable for discharge home.  I recommend that she take an H2 blocker at night and follow-up with her OB/GYN and primary care doctor.  Return precautions given, and the patient expresses understanding.  ____________________________________________   FINAL CLINICAL IMPRESSION(S) / ED DIAGNOSES  Final diagnoses:  Atypical chest pain      NEW MEDICATIONS STARTED DURING THIS VISIT:  New Prescriptions   No medications on file     Note:  This document was prepared using Dragon voice recognition software and may include  unintentional dictation errors.    Arta Silence, MD 12/31/18 [phone]

## 2018-12-31 NOTE — ED Triage Notes (Signed)
Pt reports she received her depo shot last Monday and pt noticed on Thursday that she started to have shooting pain from mid chest down into her abdomen. Pt denies N/V/D.

## 2020-06-15 IMAGING — CR DG CHEST 2V
2 series · 2 of 2 positions shown · non-contrast
Comparison: Chest radiograph dated 02/06/2018

CLINICAL DATA: 38-year-old female with chest pain.

EXAM:
CHEST - 2 VIEW

[chest pa]
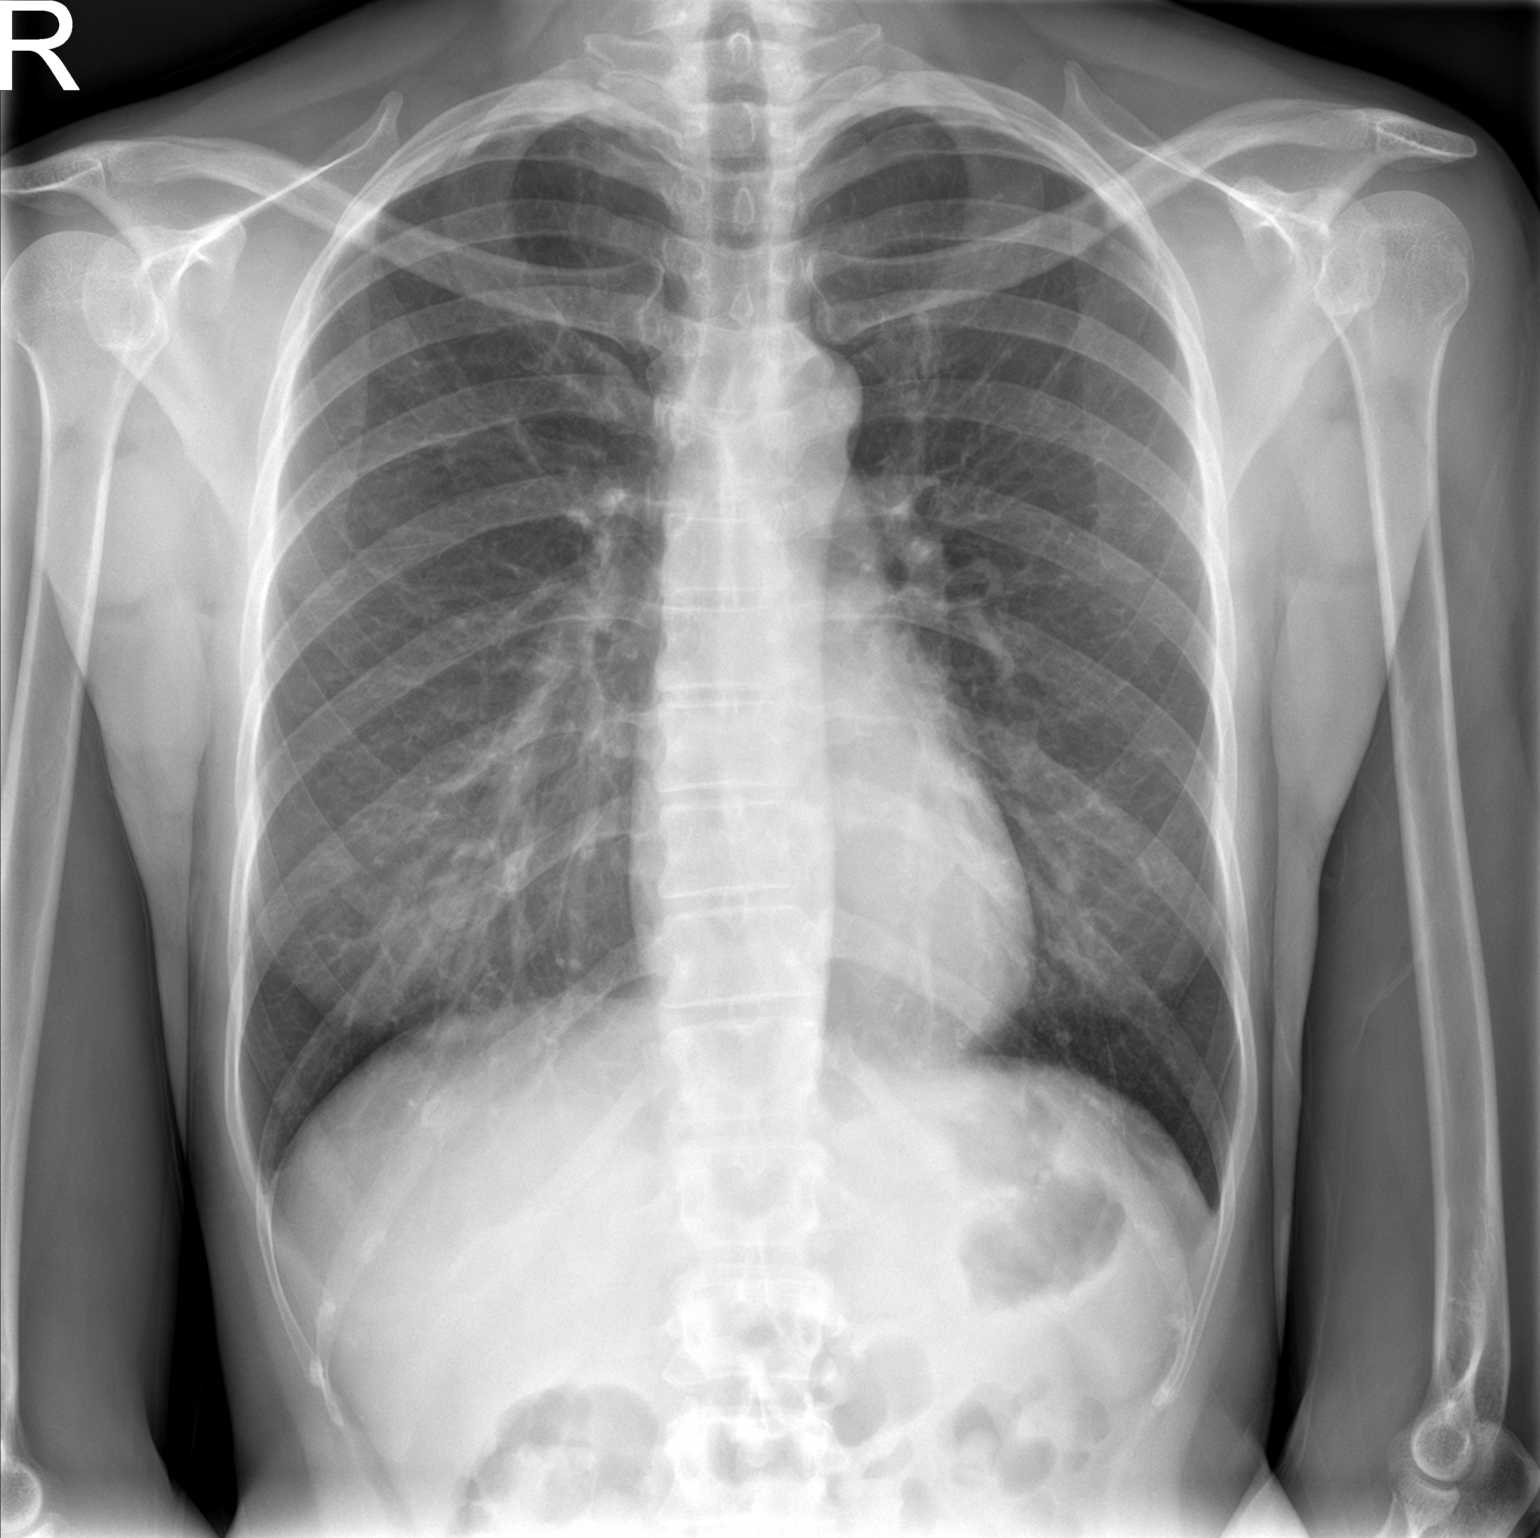

[chest lat]
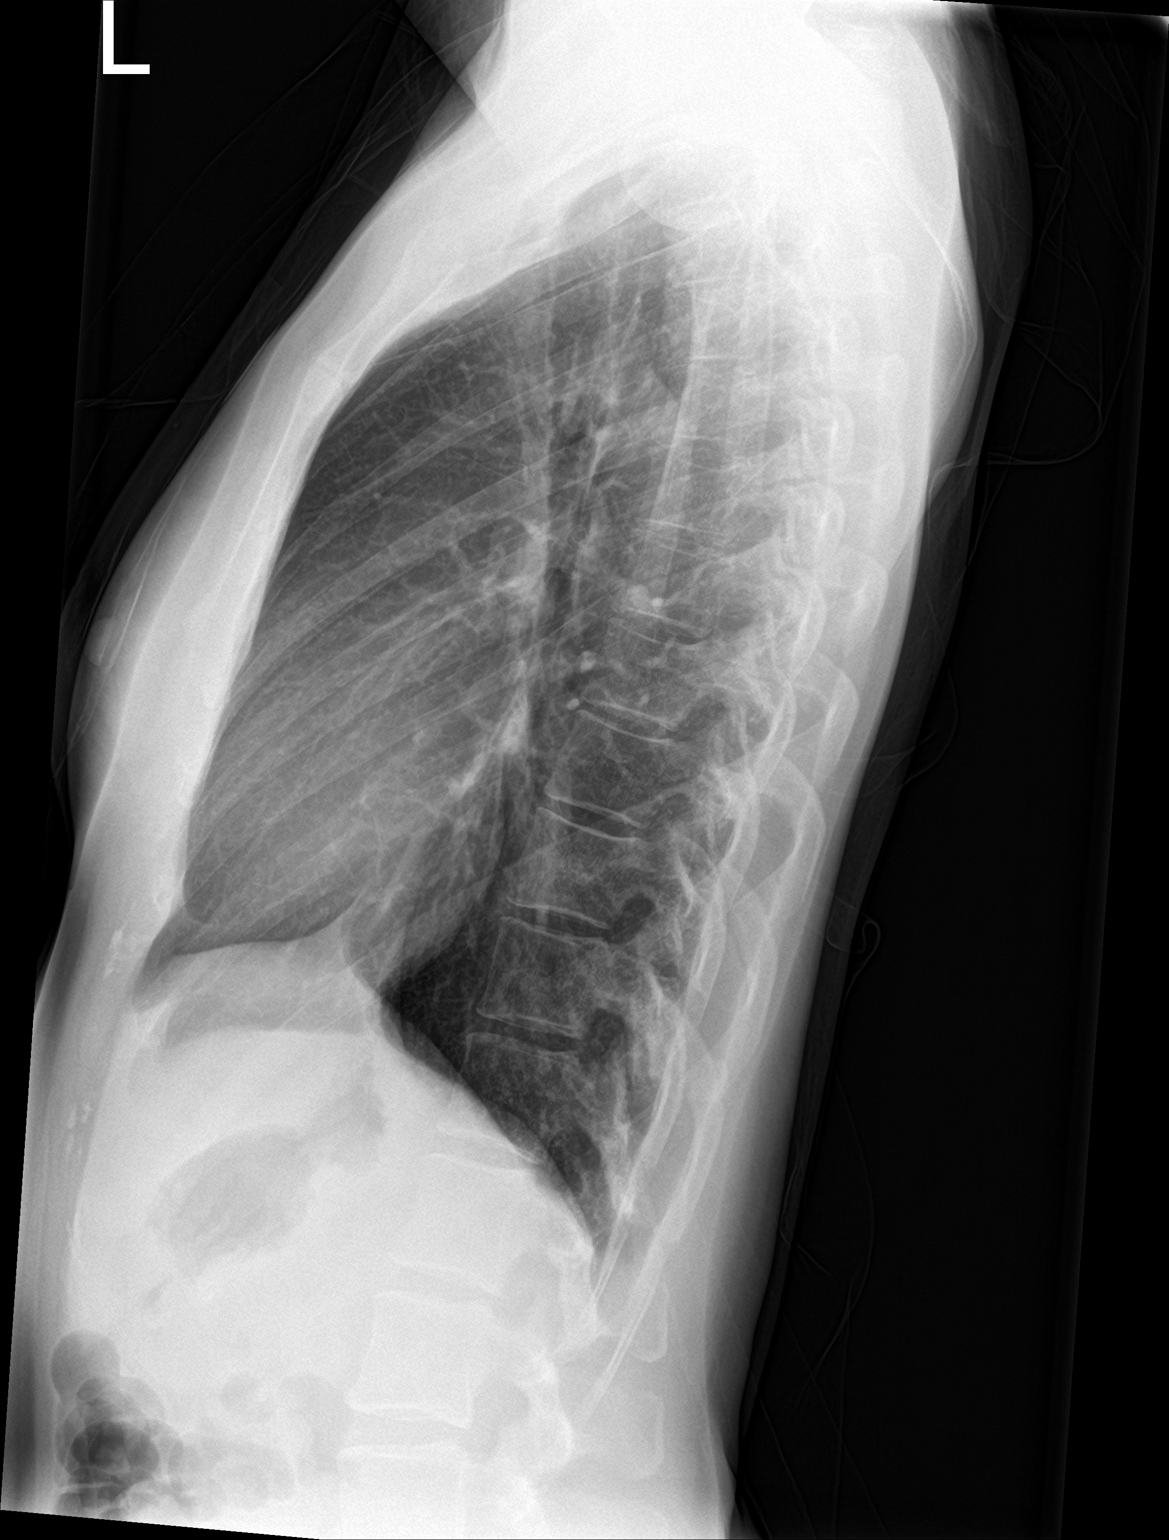

[2 of 2 positions shown; findings below may reference images not displayed]

FINDINGS: The heart size and mediastinal contours are within normal limits.
Both lungs are clear. The visualized skeletal structures are
unremarkable.
IMPRESSION: No active cardiopulmonary disease.

## 2023-06-08 ENCOUNTER — Encounter: Payer: Self-pay | Admitting: Pediatrics

## 2023-07-02 ENCOUNTER — Ambulatory Visit (AMBULATORY_SURGERY_CENTER)

## 2023-07-02 VITALS — Ht 67.5 in | Wt 141.0 lb

## 2023-07-02 DIAGNOSIS — Z1211 Encounter for screening for malignant neoplasm of colon: Secondary | ICD-10-CM

## 2023-07-02 MED ORDER — NA SULFATE-K SULFATE-MG SULF 17.5-3.13-1.6 GM/177ML PO SOLN: 1.0000 | Freq: Once | ORAL | 0 refills | Status: AC

## 2023-07-02 NOTE — Progress Notes (Signed)

## 2023-07-06 ENCOUNTER — Encounter: Payer: Self-pay | Admitting: Pediatrics

## 2023-07-15 NOTE — Progress Notes (Unsigned)
 [facility] History and Physical   Primary Care Physician:  Arva Lathe, MD   Reason for Procedure:  Colorectal cancer screening  Plan:    Screening colonoscopy     HPI: Jessica Gallegos is a 43 y.o. female undergoing screening colonoscopy for colorectal cancer screening.  This is the patient's first colonoscopy.  No family history of colorectal cancer or polyps.  Patient denies current symptoms of change in bowel habits or rectal bleeding.   Past Medical History:  Diagnosis Date   Acute blood loss anemia 12/04/2010   History of chicken pox    Maternal iron  deficiency anemia complicating pregnancy 12/04/2010   Migraine    Postpartum care following vaginal delivery (9/29) 12/03/2010    Past Surgical History:  Procedure Laterality Date   DILATION AND CURETTAGE OF UTERUS     dilation and currettage x2     LAPAROTOMY  07/21/2011   Procedure: EXPLORATORY LAPAROTOMY;  Surgeon: Kandra Orn, MD;  Location: [facility];  Service: Gynecology;  Laterality: N/A;  with tubal ligation   OVARIAN CYST REMOVAL  07/21/2011   Procedure: OVARIAN CYSTECTOMY;  Surgeon: Kandra Orn, MD;  Location: [facility];  Service: Gynecology;  Laterality: Right;  Right ovarian cystectomy.   SALPINGOOPHORECTOMY  07/21/2011   Procedure: SALPINGO OOPHERECTOMY;  Surgeon: Kandra Orn, MD;  Location: [facility];  Service: Gynecology;  Laterality: Left;    Prior to Admission medications   Medication Sig Start Date End Date Taking? Authorizing Provider  amoxicillin -clavulanate (AUGMENTIN ) 875-125 MG per tablet Take 1 tablet by mouth 2 (two) times daily. One po bid x 7 days Patient not taking: Reported on 12/13/2017 04/15/14   Manya Sells, PA-C  cholecalciferol (VITAMIN D3) 25 MCG (1000 UNIT) tablet Take 1,000 Units by mouth daily.    [provider]  EPINEPHrine  0.3 mg/0.3 mL IJ SOAJ injection Inject 0.3 mg into the muscle as needed. 05/22/23   [provider]  meclizine   (ANTIVERT ) 25 MG tablet Take 1 tablet (25 mg total) by mouth 3 (three) times daily as needed for dizziness. Patient not taking: Reported on 07/02/2023 12/13/17   Jaime Mayans, MD  medroxyPROGESTERone  (DEPO-PROVERA ) 150 MG/ML injection Inject 150 mg into the muscle every 3 (three) months. 05/13/23   [provider]  Multiple Vitamin (MULTIVITAMIN) tablet Take 1 tablet by mouth daily.    [provider]  propranolol  (INDERAL ) 10 MG tablet Take 1 tablet (10 mg total) by mouth 2 (two) times daily for 15 days. Patient not taking: Reported on 07/02/2023 03/09/18 03/24/18  Lind Repine, MD    Current Outpatient Medications  Medication Sig Dispense Refill   cholecalciferol (VITAMIN D3) 25 MCG (1000 UNIT) tablet Take 1,000 Units by mouth daily.     Multiple Vitamin (MULTIVITAMIN) tablet Take 1 tablet by mouth daily.     amoxicillin -clavulanate (AUGMENTIN ) 875-125 MG per tablet Take 1 tablet by mouth 2 (two) times daily. One po bid x 7 days (Patient not taking: Reported on 12/13/2017) 14 tablet 0   EPINEPHrine  0.3 mg/0.3 mL IJ SOAJ injection Inject 0.3 mg into the muscle as needed.     meclizine  (ANTIVERT ) 25 MG tablet Take 1 tablet (25 mg total) by mouth 3 (three) times daily as needed for dizziness. (Patient not taking: Reported on 07/16/2023) 30 tablet 0   medroxyPROGESTERone  (DEPO-PROVERA ) 150 MG/ML injection Inject 150 mg into the muscle every 3 (three) months.     propranolol  (INDERAL ) 10 MG tablet Take 1 tablet (10 mg total) by  mouth 2 (two) times daily for 15 days. (Patient not taking: Reported on 07/02/2023) 30 tablet 0   Current Facility-Administered Medications  Medication Dose Route Frequency Provider Last Rate Last Admin   0.9 %  sodium chloride  infusion  500 mL Intravenous Continuous Renella Steig, Scarlette Currier, MD        Allergies as of 07/16/2023 - Review Complete 07/16/2023  Allergen Reaction Noted   Bee venom Swelling 07/02/2023    Family History  Problem Relation Age of  Onset   Colon cancer Neg Hx    Rectal cancer Neg Hx    Stomach cancer Neg Hx    Esophageal cancer Neg Hx     Social History   Socioeconomic History   Marital status: Married    Spouse name: Not on file   Number of children: 2   Years of education: Not on file   Highest education level: Not on file  Occupational History   Occupation: MANAGER     Employer: [employer]  Tobacco Use   Smoking status: Never   Smokeless tobacco: Never  Vaping Use   Vaping status: Never Used  Substance and Sexual Activity   Alcohol use: No   Drug use: No   Sexual activity: Not on file  Other Topics Concern   Not on file  Social History Narrative   Not on file   Social Drivers of Health   Financial Resource Strain: Not on file  Food Insecurity: Not on file  Transportation Needs: Not on file  Physical Activity: Not on file  Stress: Not on file  Social Connections: Not on file  Intimate Partner Violence: Not on file    Review of Systems:  All other review of systems negative except as mentioned in the HPI.  Physical Exam: Vital signs BP 122/64   Pulse 90   Temp (!) 97.5 F (36.4 C)   Ht 5' 7.5" (1.715 m)   Wt 141 lb (64 kg)   SpO2 98%   BMI 21.76 kg/m   General:   Alert,  Well-developed, well-nourished, pleasant and cooperative in NAD Airway:  Mallampati 1 Lungs:  Clear throughout to auscultation.   Heart:  Regular rate and rhythm; no murmurs, clicks, rubs,  or gallops. Abdomen:  Soft, nontender and nondistended. Normal bowel sounds.   Neuro/Psych:  Normal mood and affect. A and O x 3  Eugenia Hess, MD [facility]

## 2023-07-16 ENCOUNTER — Ambulatory Visit (AMBULATORY_SURGERY_CENTER): Admitting: Pediatrics

## 2023-07-16 ENCOUNTER — Encounter: Payer: Self-pay | Admitting: Pediatrics

## 2023-07-16 VITALS — BP 116/80 | HR 82 | Temp 97.5°F | Resp 12 | Ht 67.5 in | Wt 141.0 lb

## 2023-07-16 DIAGNOSIS — Z1211 Encounter for screening for malignant neoplasm of colon: Secondary | ICD-10-CM

## 2023-07-16 MED ORDER — SODIUM CHLORIDE 0.9 % IV SOLN: 500.0000 mL | INTRAVENOUS | Status: DC

## 2023-07-16 NOTE — Op Note (Signed)
 [facility] Patient Name: Jessica Gallegos Procedure Date: 07/16/2023 8:53 AM MRN: [id number] Endoscopist: Eugenia Hess , MD, [id number] Age: 43 Referring MD:  Date of Birth: Jun 25, 1980 Gender: Female Account #: [id number] Procedure:                Colonoscopy Indications:              Screening for colorectal malignant neoplasm, This                            is the patient's first colonoscopy Medicines:                Monitored Anesthesia Care Procedure:                Pre-Anesthesia Assessment:                           - Prior to the procedure, a History and Physical                            was performed, and patient medications and                            allergies were reviewed. The patient's tolerance of                            previous anesthesia was also reviewed. The risks                            and benefits of the procedure and the sedation                            options and risks were discussed with the patient.                            All questions were answered, and informed consent                            was obtained. Prior Anticoagulants: The patient has                            taken no anticoagulant or antiplatelet agents. ASA                            Grade Assessment: I - A normal, healthy patient.                            After reviewing the risks and benefits, the patient                            was deemed in satisfactory condition to undergo the                            procedure.  After obtaining informed consent, the colonoscope                            was passed under direct vision. Throughout the                            procedure, the patient's blood pressure, pulse, and                            oxygen saturations were monitored continuously. The                            CF [id number] #[id number] was introduced through the anus                            and advanced to the cecum, identified  by                            appendiceal orifice and ileocecal valve. The                            colonoscopy was performed without difficulty. The                            patient tolerated the procedure well. The quality                            of the bowel preparation was good. The ileocecal                            valve, appendiceal orifice, and rectum were                            photographed. Scope In: 8:57:01 AM Scope Out: 9:10:04 AM Scope Withdrawal Time: 0 hours 7 minutes 47 seconds  Total Procedure Duration: 0 hours 13 minutes 3 seconds  Findings:                 The perianal and digital rectal examinations were                            normal. Pertinent negatives include normal                            sphincter tone and no palpable rectal lesions.                           The colon (entire examined portion) appeared normal.                           The retroflexed view of the distal rectum and anal                            verge was normal and showed no anal or rectal  abnormalities. Complications:            No immediate complications. Estimated blood loss:                            None. Estimated Blood Loss:     Estimated blood loss: none. Impression:               - The entire examined colon is normal.                           - The distal rectum and anal verge are normal on                            retroflexion view.                           - No specimens collected. Recommendation:           - Discharge patient to home (ambulatory).                           - Repeat colonoscopy in 10 years for screening                            purposes.                           - The findings and recommendations were discussed                            with the patient's family.                           - Return to referring physician.                           - Patient has a contact number available for                             emergencies. The signs and symptoms of potential                            delayed complications were discussed with the                            patient. Return to normal activities tomorrow.                            Written discharge instructions were provided to the                            patient. Eugenia Hess, MD 07/16/2023 9:13:21 AM This report has been signed electronically.

## 2023-07-16 NOTE — Progress Notes (Signed)
 Patient states there have been no changes to medical or surgical history since time of pre-visit.

## 2023-07-16 NOTE — Patient Instructions (Signed)
 Repeat colonoscopy in 10 years for screening purposes.   YOU HAD AN ENDOSCOPIC PROCEDURE TODAY AT THE [facility]:   Refer to the procedure report that was given to you for any specific questions about what was found during the examination.  If the procedure report does not answer your questions, please call your gastroenterologist to clarify.  If you requested that your care partner not be given the details of your procedure findings, then the procedure report has been included in a sealed envelope for you to review at your convenience later.  YOU SHOULD EXPECT: Some feelings of bloating in the abdomen. Passage of more gas than usual.  Walking can help get rid of the air that was put into your GI tract during the procedure and reduce the bloating. If you had a lower endoscopy (such as a colonoscopy or flexible sigmoidoscopy) you may notice spotting of blood in your stool or on the toilet paper. If you underwent a bowel prep for your procedure, you may not have a normal bowel movement for a few days.  Please Note:  You might notice some irritation and congestion in your nose or some drainage.  This is from the oxygen used during your procedure.  There is no need for concern and it should clear up in a day or so.  SYMPTOMS TO REPORT IMMEDIATELY:  Following lower endoscopy (colonoscopy or flexible sigmoidoscopy):  Excessive amounts of blood in the stool  Significant tenderness or worsening of abdominal pains  Swelling of the abdomen that is new, acute  Fever of 100F or higher  For urgent or emergent issues, a gastroenterologist can be reached at any hour by calling [phone]. Do not use MyChart messaging for urgent concerns.    DIET:  We do recommend a small meal at first, but then you may proceed to your regular diet.  Drink plenty of fluids but you should avoid alcoholic beverages for 24 hours.  ACTIVITY:  You should plan to take it easy for the rest of today and you should  NOT DRIVE or use heavy machinery until tomorrow (because of the sedation medicines used during the test).    FOLLOW UP: Our staff will call the number listed on your records the next business day following your procedure.  We will call around 7:15- 8:00 am to check on you and address any questions or concerns that you may have regarding the information given to you following your procedure. If we do not reach you, we will leave a message.     If any biopsies were taken you will be contacted by phone or by letter within the next 1-3 weeks.  Please call us at [phone] if you have not heard about the biopsies in 3 weeks.    SIGNATURES/CONFIDENTIALITY: You and/or your care partner have signed paperwork which will be entered into your electronic medical record.  These signatures attest to the fact that that the information above on your After Visit Summary has been reviewed and is understood.  Full responsibility of the confidentiality of this discharge information lies with you and/or your care-partner.

## 2023-07-16 NOTE — Progress Notes (Signed)
 Sedate, gd SR, tolerated procedure well, VSS, report to RN

## 2023-07-17 ENCOUNTER — Telehealth: Payer: Self-pay

## 2023-07-17 NOTE — Telephone Encounter (Signed)
  Follow up Call-     07/16/2023    7:56 AM  Call back number  Post procedure Call Back phone  # [phone]  Permission to leave phone message Yes     Patient questions:  Do you have a fever, pain , or abdominal swelling? No. Pain Score  0 *  Have you tolerated food without any problems? Yes.    Have you been able to return to your normal activities? Yes.    Do you have any questions about your discharge instructions: Diet   No. Medications  No. Follow up visit  No.  Do you have questions or concerns about your Care? No.  Actions: * If pain score is 4 or above: No action needed, pain <4.
